# Patient Record
Sex: Female | Born: 1965 | Race: White | Hispanic: No | Marital: Married | Smoking: Current every day smoker
Health system: Southern US, Community
[De-identification: ages and names within clinical notes are randomized; demographics above are authoritative.]

## PROBLEM LIST (undated history)

## (undated) DIAGNOSIS — Z973 Presence of spectacles and contact lenses: Secondary | ICD-10-CM

## (undated) DIAGNOSIS — M545 Low back pain, unspecified: Secondary | ICD-10-CM

## (undated) DIAGNOSIS — R29898 Other symptoms and signs involving the musculoskeletal system: Secondary | ICD-10-CM

## (undated) DIAGNOSIS — G4700 Insomnia, unspecified: Secondary | ICD-10-CM

## (undated) DIAGNOSIS — M94261 Chondromalacia, right knee: Secondary | ICD-10-CM

## (undated) DIAGNOSIS — M26609 Unspecified temporomandibular joint disorder, unspecified side: Secondary | ICD-10-CM

## (undated) DIAGNOSIS — M1990 Unspecified osteoarthritis, unspecified site: Secondary | ICD-10-CM

## (undated) DIAGNOSIS — R112 Nausea with vomiting, unspecified: Secondary | ICD-10-CM

## (undated) DIAGNOSIS — F32A Depression, unspecified: Secondary | ICD-10-CM

## (undated) DIAGNOSIS — R200 Anesthesia of skin: Secondary | ICD-10-CM

## (undated) DIAGNOSIS — G588 Other specified mononeuropathies: Secondary | ICD-10-CM

## (undated) DIAGNOSIS — M65961 Unspecified synovitis and tenosynovitis, right lower leg: Secondary | ICD-10-CM

## (undated) DIAGNOSIS — R413 Other amnesia: Secondary | ICD-10-CM

## (undated) DIAGNOSIS — Z8489 Family history of other specified conditions: Secondary | ICD-10-CM

## (undated) DIAGNOSIS — Z9889 Other specified postprocedural states: Secondary | ICD-10-CM

## (undated) DIAGNOSIS — F419 Anxiety disorder, unspecified: Secondary | ICD-10-CM

## (undated) DIAGNOSIS — F411 Generalized anxiety disorder: Secondary | ICD-10-CM

## (undated) DIAGNOSIS — E28319 Asymptomatic premature menopause: Secondary | ICD-10-CM

## (undated) DIAGNOSIS — J45998 Other asthma: Secondary | ICD-10-CM

## (undated) DIAGNOSIS — F329 Major depressive disorder, single episode, unspecified: Secondary | ICD-10-CM

## (undated) DIAGNOSIS — M659 Synovitis and tenosynovitis, unspecified: Secondary | ICD-10-CM

## (undated) DIAGNOSIS — Z87820 Personal history of traumatic brain injury: Secondary | ICD-10-CM

## (undated) HISTORY — DX: Anxiety disorder, unspecified: F41.9

## (undated) HISTORY — DX: Low back pain, unspecified: M54.50

## (undated) HISTORY — PX: OTHER SURGICAL HISTORY: SHX169

## (undated) HISTORY — DX: Low back pain: M54.5

## (undated) HISTORY — DX: Insomnia, unspecified: G47.00

---

## 2005-12-29 ENCOUNTER — Other Ambulatory Visit: Admission: RE | Admit: 2005-12-29 | Discharge: 2005-12-29 | Payer: Self-pay | Admitting: Gynecology

## 2008-05-04 HISTORY — PX: TONSILLECTOMY: SUR1361

## 2009-08-13 ENCOUNTER — Ambulatory Visit (HOSPITAL_COMMUNITY): Admission: RE | Admit: 2009-08-13 | Discharge: 2009-08-14 | Payer: Self-pay | Admitting: Orthopedic Surgery

## 2009-08-13 HISTORY — PX: OTHER SURGICAL HISTORY: SHX169

## 2010-01-17 ENCOUNTER — Ambulatory Visit (HOSPITAL_BASED_OUTPATIENT_CLINIC_OR_DEPARTMENT_OTHER): Admission: RE | Admit: 2010-01-17 | Discharge: 2010-01-18 | Payer: Self-pay | Admitting: Orthopedic Surgery

## 2010-01-17 HISTORY — PX: OTHER SURGICAL HISTORY: SHX169

## 2010-02-01 HISTORY — PX: KNEE ARTHROSCOPY: SUR90

## 2010-07-17 LAB — POCT HEMOGLOBIN-HEMACUE: Hemoglobin: 11.6 g/dL — ABNORMAL LOW (ref 12.0–15.0)

## 2010-07-23 LAB — ANAEROBIC CULTURE

## 2010-07-23 LAB — BASIC METABOLIC PANEL
BUN: 9 mg/dL (ref 6–23)
CO2: 25 mEq/L (ref 19–32)
Calcium: 9.1 mg/dL (ref 8.4–10.5)
Creatinine, Ser: 0.71 mg/dL (ref 0.4–1.2)
GFR calc Af Amer: >60 mL/min (ref >60)
GFR calc non Af Amer: >60 mL/min (ref >60)
Glucose, Bld: 105 mg/dL — ABNORMAL HIGH (ref 70–99)
Potassium: 4.3 mEq/L (ref 3.5–5.1)

## 2010-07-23 LAB — AFB CULTURE WITH SMEAR (NOT AT ARMC): Acid Fast Smear: NONE SEEN

## 2010-07-23 LAB — TISSUE CULTURE

## 2010-07-23 LAB — CBC
RDW: 17.8 % — ABNORMAL HIGH (ref 11.5–15.5)
WBC: 5.8 10*3/uL (ref 4.0–10.5)

## 2010-07-23 LAB — PROTIME-INR
INR: 1.02 (ref 0.00–1.49)
Prothrombin Time: 14.2 seconds (ref 11.6–15.2)

## 2011-05-21 DIAGNOSIS — M76829 Posterior tibial tendinitis, unspecified leg: Secondary | ICD-10-CM | POA: Insufficient documentation

## 2015-04-09 ENCOUNTER — Encounter: Payer: Self-pay | Admitting: Neurology

## 2015-04-09 ENCOUNTER — Ambulatory Visit (INDEPENDENT_AMBULATORY_CARE_PROVIDER_SITE_OTHER): Payer: Managed Care, Other (non HMO) | Admitting: Neurology

## 2015-04-09 VITALS — BP 133/86 | HR 65 | Ht 66.0 in | Wt 161.0 lb

## 2015-04-09 DIAGNOSIS — R413 Other amnesia: Secondary | ICD-10-CM

## 2015-04-09 DIAGNOSIS — M542 Cervicalgia: Secondary | ICD-10-CM | POA: Diagnosis not present

## 2015-04-09 DIAGNOSIS — S060X9A Concussion with loss of consciousness of unspecified duration, initial encounter: Secondary | ICD-10-CM | POA: Insufficient documentation

## 2015-04-09 DIAGNOSIS — G43109 Migraine with aura, not intractable, without status migrainosus: Secondary | ICD-10-CM

## 2015-04-09 DIAGNOSIS — G43909 Migraine, unspecified, not intractable, without status migrainosus: Secondary | ICD-10-CM | POA: Insufficient documentation

## 2015-04-09 DIAGNOSIS — S060X1D Concussion with loss of consciousness of 30 minutes or less, subsequent encounter: Secondary | ICD-10-CM

## 2015-04-09 MED ORDER — NORTRIPTYLINE HCL 25 MG PO CAPS: ORAL_CAPSULE | ORAL | Status: DC

## 2015-04-09 MED ORDER — RIZATRIPTAN BENZOATE 10 MG PO TBDP: 10.0000 mg | ORAL_TABLET | ORAL | Status: DC | PRN

## 2015-04-09 NOTE — Progress Notes (Signed)
PATIENT: Carly Gonzalez DOB: May 16, 1965  Chief Complaint  Patient presents with  . Migraine    Her migraines are increasing in frequency and she is getting 4-5 per week.  She tends to have vision changes, nausea, memory loss, anxiety, light and noise sensitivity associated with her headaches.  She was restarted on amitriptyline  daily six weeks ago but it has not been helfpul yet.      HISTORICAL  Carly Gonzalez is a 49 years old left-handed female, alone at today's clinical visit, she is seen in refer by her primary care physician Dr.  Hadley Pen in April 09 2015 for evaluation of migraine headaches  She has multiple right leg surgery since 2008 after fell, suffered right ankle, tibial and fibular fracture.  She has to go through intense physical therapy, had fell multiple times during that period of time, fell to the concrete floor, bumped her head in the corner of cabinet, she did have transient loss of consciousness, she then began to have frequent headaches, her typical headache started from left occipital region spreading forward become left retro-orbital area pressure pain sometimes throbbing, with associated light noise sensitivity, nauseous, it can last few hours to 1 day,  Previously she was treated with amitriptyline which has been helpful  From 2010 to 2015, she was almost headache free, was able to taper off amitriptyline, since May 2016, after she started to work, she feels stressful, she also began to notice increased headaches, with similar patterns, left retro-orbital area severe pounding migraine headaches, she is now having 4-5 migraine headaches each week, she was put back on amitriptyline 25 mg every night, was able to tolerated well, but there is no significant improvement in her headaches, she was also given Imitrex tablet as needed, which help some,  She also complains of mild memory trouble, difficulty focusing  REVIEW OF SYSTEMS: Full 14 system  review of systems performed and notable only for as above: Weight gain, fatigue, swelling legs, blurred vision, feeling hot feeling hot, increased thirst, flushing   ALLERGIES: No Known Allergies  HOME MEDICATIONS: Current Outpatient Prescriptions  Medication Sig Dispense Refill  . amitriptyline (ELAVIL) 25 MG tablet Take 25 mg by mouth at bedtime.    . clonazePAM (KLONOPIN) 0.5 MG tablet   0  . gabapentin (NEURONTIN) 300 MG capsule   0  . LORazepam (ATIVAN) 1 MG tablet take 1 tablet by mouth once daily if needed  0  . meloxicam (MOBIC) 15 MG tablet Take 15 mg by mouth as needed for pain.    . predniSONE (STERAPRED UNI-PAK 21 TAB) 10 MG (21) TBPK tablet take as directed PER PACKAGE INSTRUCTIONS  0  . SUMAtriptan (IMITREX) 100 MG tablet take 1 tablet by mouth AT ONSET OF HEADACHES. MAY REPEAT 1 TABLET...  (REFER TO PRESCRIPTION NOTES).  0  . traZODone (DESYREL) 50 MG tablet   0   No current facility-administered medications for this visit.    PAST MEDICAL HISTORY: Past Medical History  Diagnosis Date  . Anxiety   . Insomnia   . Migraine     PAST SURGICAL HISTORY: Past Surgical History  Procedure Laterality Date  . Leg surgery      x 7    FAMILY HISTORY: Family History  Problem Relation Age of Onset  . Healthy Mother   . Heart attack Father     SOCIAL HISTORY:  Social History   Social History  . Marital Status: Married    Spouse Name:  N/A  . Number of Children: 2  . Years of Education: College   Occupational History  . 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 Smoking status: Former Games developermoker  . Smokeless tobacco: Not on file     Comment: Stopped 10 years ago  . Alcohol Use: 0.0 oz/week    0 Standard drinks or equivalent per week     Comment: Occasional wine  . Drug Use: No  . Sexual Activity: Not on file   Other Topics Concern  . Not on file   Social History Narrative   Lives at home with family.   Left-handed.   2  cups caffeine per day.     PHYSICAL EXAM   Filed Vitals:   04/09/15 1613  BP: 133/86  Pulse: 65  Height: 5\' 6"  (1.676 m)  Weight: 161 lb (73.029 kg)    Not recorded      Body mass index is 26 kg/(m^2).  PHYSICAL EXAMNIATION:  Gen: NAD, conversant, well nourised, obese, well groomed                     Cardiovascular: Regular rate rhythm, no peripheral edema, warm, nontender. Eyes: Conjunctivae clear without exudates or hemorrhage Neck: Supple, no carotid bruise. Pulmonary: Clear to auscultation bilaterally  Neuromusculoskeletal: Severe tenderness along left sternal:   NEUROLOGICAL EXAM:  MENTAL STATUS: Speech:    Speech is normal; fluent and spontaneous with normal comprehension.  Cognition:     Orientation to time, place and person     Normal recent and remote memory     Normal Attention span and concentration     Normal Language, naming, repeating,spontaneous speech     Fund of knowledge   CRANIAL NERVES: CN II: Visual fields are full to confrontation. Fundoscopic exam is normal with sharp discs and no vascular changes. Pupils are round equal and briskly reactive to light. CN III, IV, VI: extraocular movement are normal. No ptosis. CN V: Facial sensation is intact to pinprick in all 3 divisions bilaterally. Corneal responses are intact.  CN VII: Face is symmetric with normal eye closure and smile. CN VIII: Hearing is normal to rubbing fingers CN IX, X: Palate elevates symmetrically. Phonation is normal. CN XI: Head turning and shoulder shrug are intact CN XII: Tongue is midline with normal movements and no atrophy.  MOTOR: There is no pronator drift of out-stretched arms. Muscle bulk and tone are normal. Muscle strength is normal.  REFLEXES: Reflexes are 2+ and symmetric at the biceps, triceps, knees, and ankles. Plantar responses are flexor.  SENSORY: Intact to light touch, pinprick, position sense, and vibration sense are intact in fingers and  toes.  COORDINATION: Rapid alternating movements and fine finger movements are intact. There is no dysmetria on finger-to-nose and heel-knee-shin.    GAIT/STANCE: Posture is normal. Gait is steady with normal steps, base, arm swing, and turning. Heel and toe walking are normal. Tandem gait is normal.  Romberg is absent.   DIAGNOSTIC DATA (LABS, IMAGING, TESTING) - I reviewed patient records, labs, notes, testing and imaging myself where available.   ASSESSMENT AND PLAN  Marylin CrosbyKathleen Aina is a 49 y.o. female   Chronic migraine with visual aura  Increase nortriptyline to 25 mg 2 tablets every night  Maxalt 10 mg as needed Memory loss  MRI of the brain to rule out structural lesions  Laboratory evaluations from primary care physician  Left Upper nuchal area pain,  Component of cervicogenic pain musculoskeletal etiology    I also performed trigger point injection today at left upper lobe region   1 cc of dexamethasone= 6 mg+ 1 cc of lidocaine= 4 mg  Injection was performed  along left nuchal line, where the  most tenderness was, she tolerated the injection well,   I also advised her to do neck stretching exercise, Heat compression      Levert Feinstein, M.D. Ph.D.  [facility] [address] [place], [place] [address] Ph: [phone] Fax: [phone]  CC: Hadley Pen, MD

## 2015-04-10 ENCOUNTER — Encounter: Payer: Self-pay | Admitting: *Deleted

## 2015-04-10 ENCOUNTER — Telehealth: Payer: Self-pay | Admitting: Neurology

## 2015-04-10 DIAGNOSIS — R413 Other amnesia: Secondary | ICD-10-CM | POA: Insufficient documentation

## 2015-04-10 NOTE — Telephone Encounter (Signed)
Patient called requesting to speak to a nurse regarding the headache she is experiencing today along with red face and believes is coming from her injection from yesterday. Please call and advise. Patient can be reached @ [phone]

## 2015-04-10 NOTE — Telephone Encounter (Signed)
Spoke to patient - she still has a headache, facial flushing (reported she usually gets this with headaches) and soreness at the injection site.  She is concerned about the injection site and Dr. Terrace ArabiaYan has offered for her to come by today so she could take a look at it. She is going to come by our office this afternoon.  She has not taken any medication for her headache.  Instructed her to take the Maxalt.

## 2015-04-11 ENCOUNTER — Telehealth: Payer: Self-pay | Admitting: *Deleted

## 2015-04-11 NOTE — Telephone Encounter (Signed)
Carly JohnsKathleen never came by our office yesterday. I called to check on her this morning and she is feeling much better today.

## 2015-04-11 NOTE — Telephone Encounter (Signed)
[facility].

## 2015-04-24 ENCOUNTER — Other Ambulatory Visit: Payer: Managed Care, Other (non HMO)

## 2015-05-28 ENCOUNTER — Ambulatory Visit: Payer: Managed Care, Other (non HMO) | Admitting: Neurology

## 2015-05-29 ENCOUNTER — Encounter: Payer: Self-pay | Admitting: Neurology

## 2016-07-07 ENCOUNTER — Encounter (HOSPITAL_BASED_OUTPATIENT_CLINIC_OR_DEPARTMENT_OTHER): Payer: Self-pay | Admitting: *Deleted

## 2016-07-07 NOTE — Progress Notes (Signed)
NPO AFTER MN.  ARRIVE AT 1030.  NEEDS HG.  WILL TAKE GABAPENTIN, NEXIUM, AND IF NEEDED HYDROCODONE AM DOS W/ SIPS OF WATER.  PER PT POSSIBLE OVERNIGHT STAY.

## 2016-07-13 ENCOUNTER — Encounter (HOSPITAL_BASED_OUTPATIENT_CLINIC_OR_DEPARTMENT_OTHER): Payer: Self-pay | Admitting: *Deleted

## 2016-07-13 ENCOUNTER — Ambulatory Visit (HOSPITAL_BASED_OUTPATIENT_CLINIC_OR_DEPARTMENT_OTHER): Payer: 59 | Admitting: Anesthesiology

## 2016-07-13 ENCOUNTER — Observation Stay (HOSPITAL_BASED_OUTPATIENT_CLINIC_OR_DEPARTMENT_OTHER)
Admission: RE | Admit: 2016-07-13 | Discharge: 2016-07-14 | Disposition: A | Discharge status: Home / Self Care | Payer: 59 | Source: Ambulatory Visit | Attending: Orthopedic Surgery | Admitting: Orthopedic Surgery

## 2016-07-13 ENCOUNTER — Encounter (HOSPITAL_COMMUNITY)
Admission: RE | Disposition: A | Discharge status: Home / Self Care | Payer: Self-pay | Source: Ambulatory Visit | Attending: Orthopedic Surgery

## 2016-07-13 DIAGNOSIS — M94261 Chondromalacia, right knee: Secondary | ICD-10-CM | POA: Diagnosis not present

## 2016-07-13 DIAGNOSIS — J45998 Other asthma: Secondary | ICD-10-CM | POA: Insufficient documentation

## 2016-07-13 DIAGNOSIS — K219 Gastro-esophageal reflux disease without esophagitis: Secondary | ICD-10-CM | POA: Insufficient documentation

## 2016-07-13 DIAGNOSIS — F411 Generalized anxiety disorder: Secondary | ICD-10-CM | POA: Insufficient documentation

## 2016-07-13 DIAGNOSIS — F17210 Nicotine dependence, cigarettes, uncomplicated: Secondary | ICD-10-CM | POA: Insufficient documentation

## 2016-07-13 DIAGNOSIS — R110 Nausea: Secondary | ICD-10-CM | POA: Insufficient documentation

## 2016-07-13 DIAGNOSIS — G4700 Insomnia, unspecified: Secondary | ICD-10-CM | POA: Insufficient documentation

## 2016-07-13 DIAGNOSIS — F419 Anxiety disorder, unspecified: Secondary | ICD-10-CM | POA: Diagnosis not present

## 2016-07-13 DIAGNOSIS — M659 Synovitis and tenosynovitis, unspecified: Secondary | ICD-10-CM | POA: Diagnosis not present

## 2016-07-13 DIAGNOSIS — M65961 Unspecified synovitis and tenosynovitis, right lower leg: Secondary | ICD-10-CM | POA: Diagnosis present

## 2016-07-13 DIAGNOSIS — Z79899 Other long term (current) drug therapy: Secondary | ICD-10-CM | POA: Diagnosis not present

## 2016-07-13 HISTORY — DX: Other symptoms and signs involving the musculoskeletal system: R29.898

## 2016-07-13 HISTORY — DX: Asymptomatic premature menopause: E28.319

## 2016-07-13 HISTORY — DX: Personal history of traumatic brain injury: Z87.820

## 2016-07-13 HISTORY — DX: Major depressive disorder, single episode, unspecified: F32.9

## 2016-07-13 HISTORY — DX: Other asthma: J45.998

## 2016-07-13 HISTORY — DX: Unspecified temporomandibular joint disorder, unspecified side: M26.609

## 2016-07-13 HISTORY — DX: Generalized anxiety disorder: F41.1

## 2016-07-13 HISTORY — DX: Unspecified osteoarthritis, unspecified site: M19.90

## 2016-07-13 HISTORY — DX: Unspecified synovitis and tenosynovitis, right lower leg: M65.961

## 2016-07-13 HISTORY — DX: Nausea with vomiting, unspecified: R11.2

## 2016-07-13 HISTORY — DX: Other amnesia: R41.3

## 2016-07-13 HISTORY — PX: KNEE ARTHROSCOPY: SHX127

## 2016-07-13 HISTORY — DX: Other specified postprocedural states: Z98.890

## 2016-07-13 HISTORY — DX: Family history of other specified conditions: Z84.89

## 2016-07-13 HISTORY — DX: Depression, unspecified: F32.A

## 2016-07-13 HISTORY — DX: Synovitis and tenosynovitis, unspecified: M65.9

## 2016-07-13 HISTORY — DX: Chondromalacia, right knee: M94.261

## 2016-07-13 HISTORY — DX: Other specified mononeuropathies: G58.8

## 2016-07-13 HISTORY — DX: Presence of spectacles and contact lenses: Z97.3

## 2016-07-13 HISTORY — DX: Anesthesia of skin: R20.0

## 2016-07-13 LAB — HEMOGLOBIN: Hemoglobin: 12.6 g/dL (ref 12.0–15.0)

## 2016-07-13 LAB — POCT HEMOGLOBIN-HEMACUE: Hemoglobin: 13.6 g/dL (ref 12.0–15.0)

## 2016-07-13 SURGERY — ARTHROSCOPY, KNEE
Anesthesia: General | Site: Knee | Laterality: Right

## 2016-07-13 MED ORDER — DEXAMETHASONE SODIUM PHOSPHATE 4 MG/ML IJ SOLN
INTRAMUSCULAR | Status: DC | PRN
  Administered 2016-07-13: 10 mg via INTRAVENOUS

## 2016-07-13 MED ORDER — TRAZODONE HCL 50 MG PO TABS
100.0000 mg | ORAL_TABLET | Freq: Every day | ORAL | Status: DC
  Administered 2016-07-13: 22:00:00 100 mg via ORAL
  Filled 2016-07-13: qty 2
  Filled 2016-07-13: qty 1

## 2016-07-13 MED ORDER — FENTANYL CITRATE (PF) 100 MCG/2ML IJ SOLN
INTRAMUSCULAR | Status: DC | PRN
  Administered 2016-07-13 (×2): 25 ug via INTRAVENOUS
  Administered 2016-07-13: 50 ug via INTRAVENOUS

## 2016-07-13 MED ORDER — HYDROCODONE-ACETAMINOPHEN 5-325 MG PO TABS
1.0000 | ORAL_TABLET | Freq: Four times a day (QID) | ORAL | Status: DC | PRN
  Administered 2016-07-13: 1 via ORAL
  Filled 2016-07-13 (×2): qty 1

## 2016-07-13 MED ORDER — BUPIVACAINE HCL 0.25 % IJ SOLN
INTRAMUSCULAR | Status: DC | PRN
  Administered 2016-07-13: 20 mL

## 2016-07-13 MED ORDER — ASPIRIN EC 81 MG PO TBEC: 81.0000 mg | DELAYED_RELEASE_TABLET | Freq: Every day | ORAL | 0 refills | Status: AC

## 2016-07-13 MED ORDER — PANTOPRAZOLE SODIUM 40 MG PO TBEC
80.0000 mg | DELAYED_RELEASE_TABLET | Freq: Every day | ORAL | Status: DC
  Administered 2016-07-14: 11:00:00 80 mg via ORAL
  Filled 2016-07-13 (×2): qty 2

## 2016-07-13 MED ORDER — DIPHENHYDRAMINE HCL 12.5 MG/5ML PO ELIX
12.5000 mg | ORAL_SOLUTION | ORAL | Status: DC | PRN
  Filled 2016-07-13: qty 10

## 2016-07-13 MED ORDER — OXYCODONE HCL 5 MG PO TABS: 5.0000 mg | ORAL_TABLET | ORAL | 0 refills | Status: DC | PRN

## 2016-07-13 MED ORDER — SODIUM CHLORIDE 0.9 % IR SOLN
Status: DC | PRN
  Administered 2016-07-13: 6000 mL

## 2016-07-13 MED ORDER — MEPERIDINE HCL 25 MG/ML IJ SOLN
6.2500 mg | INTRAMUSCULAR | Status: DC | PRN
  Filled 2016-07-13: qty 1

## 2016-07-13 MED ORDER — PROPOFOL 10 MG/ML IV BOLUS
INTRAVENOUS | Status: AC
  Filled 2016-07-13: qty 20

## 2016-07-13 MED ORDER — LIDOCAINE 2% (20 MG/ML) 5 ML SYRINGE
INTRAMUSCULAR | Status: DC | PRN
  Administered 2016-07-13: 100 mg via INTRAVENOUS

## 2016-07-13 MED ORDER — GABAPENTIN 400 MG PO CAPS
800.0000 mg | ORAL_CAPSULE | Freq: Every day | ORAL | Status: DC
  Administered 2016-07-13: 22:00:00 800 mg via ORAL
  Filled 2016-07-13: qty 2

## 2016-07-13 MED ORDER — MIDAZOLAM HCL 2 MG/2ML IJ SOLN
0.5000 mg | Freq: Once | INTRAMUSCULAR | Status: DC | PRN
  Filled 2016-07-13: qty 2

## 2016-07-13 MED ORDER — OXYCODONE HCL 5 MG PO TABS
5.0000 mg | ORAL_TABLET | ORAL | Status: DC | PRN
  Administered 2016-07-13 – 2016-07-14 (×6): 10 mg via ORAL
  Filled 2016-07-13 (×7): qty 2

## 2016-07-13 MED ORDER — ONDANSETRON 4 MG PO TBDP: 4.0000 mg | ORAL_TABLET | Freq: Three times a day (TID) | ORAL | 0 refills | Status: DC | PRN

## 2016-07-13 MED ORDER — ONDANSETRON HCL 4 MG/2ML IJ SOLN
INTRAMUSCULAR | Status: DC | PRN
Start: 2016-07-13 — End: 2016-07-13
  Administered 2016-07-13: 4 mg via INTRAVENOUS

## 2016-07-13 MED ORDER — MIDAZOLAM HCL 2 MG/2ML IJ SOLN
INTRAMUSCULAR | Status: AC
  Filled 2016-07-13: qty 2

## 2016-07-13 MED ORDER — MORPHINE SULFATE (PF) 4 MG/ML IV SOLN: 2.0000 mg | INTRAVENOUS | Status: DC | PRN

## 2016-07-13 MED ORDER — PROMETHAZINE HCL 25 MG/ML IJ SOLN
INTRAMUSCULAR | Status: AC
  Filled 2016-07-13: qty 1

## 2016-07-13 MED ORDER — FENTANYL CITRATE (PF) 100 MCG/2ML IJ SOLN
25.0000 ug | INTRAMUSCULAR | Status: DC | PRN
  Administered 2016-07-13: 50 ug via INTRAVENOUS
  Filled 2016-07-13: qty 1

## 2016-07-13 MED ORDER — LIDOCAINE 2% (20 MG/ML) 5 ML SYRINGE
INTRAMUSCULAR | Status: AC
  Filled 2016-07-13: qty 5

## 2016-07-13 MED ORDER — ONDANSETRON HCL 4 MG PO TABS
4.0000 mg | ORAL_TABLET | Freq: Four times a day (QID) | ORAL | Status: DC | PRN
  Filled 2016-07-13: qty 1

## 2016-07-13 MED ORDER — SCOPOLAMINE 1 MG/3DAYS TD PT72
1.0000 | MEDICATED_PATCH | TRANSDERMAL | Status: DC
  Administered 2016-07-13: 1.5 mg via TRANSDERMAL
  Filled 2016-07-13: qty 1

## 2016-07-13 MED ORDER — PROPOFOL 10 MG/ML IV BOLUS
INTRAVENOUS | Status: DC | PRN
  Administered 2016-07-13: 200 mg via INTRAVENOUS

## 2016-07-13 MED ORDER — GABAPENTIN 400 MG PO CAPS
400.0000 mg | ORAL_CAPSULE | Freq: Two times a day (BID) | ORAL | Status: DC
  Administered 2016-07-13: 400 mg via ORAL
  Filled 2016-07-13: qty 1

## 2016-07-13 MED ORDER — PROMETHAZINE HCL 25 MG/ML IJ SOLN
6.2500 mg | INTRAMUSCULAR | Status: DC | PRN
  Administered 2016-07-13: 6.25 mg via INTRAVENOUS
  Filled 2016-07-13: qty 1

## 2016-07-13 MED ORDER — ACETAMINOPHEN 650 MG RE SUPP
650.0000 mg | Freq: Four times a day (QID) | RECTAL | Status: DC | PRN
  Filled 2016-07-13: qty 1

## 2016-07-13 MED ORDER — LACTATED RINGERS IV SOLN
INTRAVENOUS | Status: DC
Start: 2016-07-13 — End: 2016-07-13
  Administered 2016-07-13 (×2): via INTRAVENOUS
  Filled 2016-07-13: qty 1000

## 2016-07-13 MED ORDER — CHLORHEXIDINE GLUCONATE 4 % EX LIQD
60.0000 mL | Freq: Once | CUTANEOUS | Status: DC
  Filled 2016-07-13: qty 118

## 2016-07-13 MED ORDER — FENTANYL CITRATE (PF) 100 MCG/2ML IJ SOLN
INTRAMUSCULAR | Status: AC
  Filled 2016-07-13: qty 2

## 2016-07-13 MED ORDER — MORPHINE SULFATE (PF) 2 MG/ML IV SOLN
2.0000 mg | INTRAVENOUS | Status: DC | PRN
  Filled 2016-07-13: qty 1

## 2016-07-13 MED ORDER — MIDAZOLAM HCL 5 MG/5ML IJ SOLN
INTRAMUSCULAR | Status: DC | PRN
  Administered 2016-07-13: 2 mg via INTRAVENOUS

## 2016-07-13 MED ORDER — DEXAMETHASONE SODIUM PHOSPHATE 10 MG/ML IJ SOLN
INTRAMUSCULAR | Status: AC
  Filled 2016-07-13: qty 1

## 2016-07-13 MED ORDER — ONDANSETRON HCL 4 MG/2ML IJ SOLN
4.0000 mg | Freq: Four times a day (QID) | INTRAMUSCULAR | Status: DC | PRN
  Administered 2016-07-14: 01:00:00 4 mg via INTRAVENOUS
  Filled 2016-07-13 (×2): qty 2

## 2016-07-13 MED ORDER — ONDANSETRON HCL 4 MG/2ML IJ SOLN
INTRAMUSCULAR | Status: AC
  Filled 2016-07-13: qty 2

## 2016-07-13 MED ORDER — ACETAMINOPHEN 325 MG PO TABS
650.0000 mg | ORAL_TABLET | Freq: Four times a day (QID) | ORAL | Status: DC | PRN
  Administered 2016-07-14: 10:00:00 650 mg via ORAL
  Filled 2016-07-13 (×2): qty 2

## 2016-07-13 MED ORDER — CYCLOBENZAPRINE HCL 5 MG PO TABS
5.0000 mg | ORAL_TABLET | Freq: Every day | ORAL | Status: DC
  Administered 2016-07-13: 22:00:00 5 mg via ORAL
  Filled 2016-07-13 (×2): qty 1

## 2016-07-13 MED ORDER — SCOPOLAMINE 1 MG/3DAYS TD PT72
MEDICATED_PATCH | TRANSDERMAL | Status: AC
  Filled 2016-07-13: qty 1

## 2016-07-13 MED ORDER — LORAZEPAM 1 MG PO TABS
1.0000 mg | ORAL_TABLET | Freq: Four times a day (QID) | ORAL | Status: DC | PRN
  Administered 2016-07-13 – 2016-07-14 (×3): 1 mg via ORAL
  Filled 2016-07-13 (×4): qty 1

## 2016-07-13 MED ORDER — GABAPENTIN 400 MG PO CAPS
400.0000 mg | ORAL_CAPSULE | Freq: Every day | ORAL | Status: DC
  Administered 2016-07-14: 11:00:00 400 mg via ORAL
  Filled 2016-07-13 (×2): qty 1

## 2016-07-13 MED ORDER — CEFAZOLIN SODIUM-DEXTROSE 2-4 GM/100ML-% IV SOLN
2.0000 g | INTRAVENOUS | Status: AC
  Administered 2016-07-13: 2 g via INTRAVENOUS
  Filled 2016-07-13: qty 100

## 2016-07-13 MED ORDER — CEFAZOLIN SODIUM-DEXTROSE 2-4 GM/100ML-% IV SOLN
INTRAVENOUS | Status: AC
  Filled 2016-07-13: qty 100

## 2016-07-13 SURGICAL SUPPLY — 48 items
BANDAGE ELASTIC 6 VELCRO ST LF (GAUZE/BANDAGES/DRESSINGS) ×3 IMPLANT
BANDAGE ESMARK 6X9 LF (GAUZE/BANDAGES/DRESSINGS) IMPLANT
BLADE CUDA GRT WHITE 3.5 (BLADE) ×3 IMPLANT
BLADE CUTTER GATOR 3.5 (BLADE) ×3 IMPLANT
BLADE GREAT WHITE 4.2 (BLADE) IMPLANT
BLADE GREAT WHITE 4.2MM (BLADE)
BNDG CMPR 9X6 STRL LF SNTH (GAUZE/BANDAGES/DRESSINGS)
BNDG ESMARK 6X9 LF (GAUZE/BANDAGES/DRESSINGS)
CUFF TOURNIQUET SINGLE 34IN LL (TOURNIQUET CUFF) ×3 IMPLANT
DRAPE ARTHROSCOPY W/POUCH 114 (DRAPES) ×3 IMPLANT
DRAPE C-ARM 42X72 X-RAY (DRAPES) IMPLANT
DRAPE LG THREE QUARTER DISP (DRAPES) ×3 IMPLANT
DRAPE U-SHAPE 47X51 STRL (DRAPES) ×3 IMPLANT
DRSG PAD ABDOMINAL 8X10 ST (GAUZE/BANDAGES/DRESSINGS) ×4 IMPLANT
DURAPREP 26ML APPLICATOR (WOUND CARE) ×3 IMPLANT
GAUZE SPONGE 4X4 8PLY STR LF (GAUZE/BANDAGES/DRESSINGS) ×2 IMPLANT
GAUZE XEROFORM 1X8 LF (GAUZE/BANDAGES/DRESSINGS) ×3 IMPLANT
GLOVE BIO SURGEON STRL SZ7.5 (GLOVE) ×3 IMPLANT
GLOVE BIOGEL PI IND STRL 7.5 (GLOVE) IMPLANT
GLOVE BIOGEL PI IND STRL 8 (GLOVE) ×1 IMPLANT
GLOVE BIOGEL PI INDICATOR 7.5 (GLOVE) ×6
GLOVE BIOGEL PI INDICATOR 8 (GLOVE) ×2
GOWN STRL REUS W/ TWL XL LVL3 (GOWN DISPOSABLE) IMPLANT
GOWN STRL REUS W/TWL LRG LVL3 (GOWN DISPOSABLE) ×4 IMPLANT
GOWN STRL REUS W/TWL XL LVL3 (GOWN DISPOSABLE)
IV NS IRRIG 3000ML ARTHROMATIC (IV SOLUTION) ×6 IMPLANT
KIT RM TURNOVER CYSTO AR (KITS) ×3 IMPLANT
KNEE WRAP E Z 3 GEL PACK (MISCELLANEOUS) ×3 IMPLANT
MANIFOLD NEPTUNE II (INSTRUMENTS) ×3 IMPLANT
NEEDLE HYPO 22GX1.5 SAFETY (NEEDLE) ×3 IMPLANT
PACK ARTHROSCOPY DSU (CUSTOM PROCEDURE TRAY) ×3 IMPLANT
PACK BASIN DAY SURGERY FS (CUSTOM PROCEDURE TRAY) ×3 IMPLANT
PAD ARMBOARD 7.5X6 YLW CONV (MISCELLANEOUS) IMPLANT
PADDING CAST ABS 4INX4YD NS (CAST SUPPLIES) ×2
PADDING CAST ABS COTTON 4X4 ST (CAST SUPPLIES) IMPLANT
PROBE BIPOLAR 50 DEGREE SUCT (MISCELLANEOUS) IMPLANT
PROBE BIPOLAR ATHRO 135MM 90D (MISCELLANEOUS) IMPLANT
SET ARTHROSCOPY TUBING (MISCELLANEOUS) ×3
SET ARTHROSCOPY TUBING LN (MISCELLANEOUS) ×1 IMPLANT
SHAVER 4.2 MM LANZA 9391A (BLADE) IMPLANT
SPONGE GAUZE 4X4 12PLY (GAUZE/BANDAGES/DRESSINGS) ×3 IMPLANT
SUT ETHILON 3 0 PS 1 (SUTURE) ×3 IMPLANT
SYR CONTROL 10ML LL (SYRINGE) ×3 IMPLANT
TOWEL OR 17X24 6PK STRL BLUE (TOWEL DISPOSABLE) ×3 IMPLANT
TUBE CONNECTING 12'X1/4 (SUCTIONS) ×1
TUBE CONNECTING 12X1/4 (SUCTIONS) ×2 IMPLANT
WAND 30 DEG SABER W/CORD (SURGICAL WAND) IMPLANT
WATER STERILE IRR 500ML POUR (IV SOLUTION) ×3 IMPLANT

## 2016-07-13 NOTE — Transfer of Care (Signed)
Immediate Anesthesia Transfer of Care Note  Patient: Carly CrosbyKathleen Gonzalez  Procedure(s) Performed: Procedure(s) (LRB): Right knee arthroscopy with limited synovectomy and chondroplasty medial femeral chondial, lateral tibia plateau  (Right)  Patient Location: PACU  Anesthesia Type: General  Level of Consciousness: awake, oriented, sedated and patient cooperative  Airway & Oxygen Therapy: Patient Spontanous Breathing and Patient connected to face mask oxygen  Post-op Assessment: Report given to PACU RN and Post -op Vital signs reviewed and stable  Post vital signs: Reviewed and stable  Complications: No apparent anesthesia complications

## 2016-07-13 NOTE — H&P (Signed)
ORTHOPAEDIC H and P  REQUESTING PHYSICIAN: Yolonda KidaJason Patrick Jaedah Lords, MD  PCP:  Hadley PenOBBINS,ROBERT A, MD  Chief Complaint: Right knee pain  HPI: Carly Gonzalez is a 51 y.o. female who complains of recalcitrant right knee pain and swelling.  She has a hx of work related injury to bilateral knees and back.  She has been under my care and my partner's for over 5 months.  She has right knee pain that is persistent despite injections and extensive Pt.  We have discussed arthroscopy as an option for management and she is interested in persuing that treatment and presents today for that intervention.  We reviewed the risks and benefits in my clinic last month and she is ready to proceed today with surgery.  Past Medical History:  Diagnosis Date  . Arthritis   . Chondromalacia of lateral tibial plateau, right   . Depression   . Early menopause occurring in patient age younger than 45 years   . Family history of adverse reaction to anesthesia    MOTHER-- SEVERE PONV  . GAD (generalized anxiety disorder)   . History of concussion    x4  per pt from 10/ 2010 to 2011 due to right leg numbness/ weakness post injury, leg would give way fall and hit her head--- per pt residual decreased eye sight , corrected w/ glasses,  and mild short-term memory issues  . Insomnia   . Numbness of right foot    right ankle/foot  post injury 2010  . Pinched vertebral nerve    lumbar region from fall 02-02-2016-- pain down right hip/ goin --  uses heating pad  . PONV (postoperative nausea and vomiting)    AND ANXIETY  . Poor short term memory    post concussion  . Right leg weakness    post injury  . Seasonal asthma   . Synovitis of right knee   . 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   left side recently irritated from tooth extraction due to abscess and bone infection (wk of 07-01-2016)  . Wears glasses    Past Surgical History:  Procedure Laterality Date  . CESAREAN SECTION  1994    . EXCISION RIGHT DISTAL THIGH COMMON PERONEAL NERVE [place] NEUROLYSIS  01/17/2010  . KNEE ARTHROSCOPY Right 02/2010  . ORIF RIGHT TIB-FIB FRACTURES  [facility]-01-2009   [facility]  . ORIF RIGHT TIBIA FX (PROVISIONAL) IM NAILING/ REMOVAL DEEP IMPLANTS MEDIAL TIBIA/ TAKEDOWN NONUNION SITE  08/13/2009  . TONSILLECTOMY  2010   Social History   Social History  . Marital status: Married    Spouse name: N/A  . Number of children: 2  . Years of education: College   Occupational History  . 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 Smoking status: Current Every Day Smoker    Packs/day: 0.25    Types: Cigarettes  . Smokeless tobacco: Never Used     Comment: smokes 3 cig's daily  . Alcohol use 0.0 oz/week     Comment: Occasional wine  . Drug use: No  . Sexual activity: Not Asked   Other Topics Concern  . None   Social History Narrative   Lives at home with family.   Left-handed.   2 cups caffeine per day.   Family History  Problem Relation Age of Onset  . Heart attack Father   . Healthy Mother    Allergies  Allergen Reactions  .  Hydrocodone Itching   Prior to Admission medications   Medication Sig Start Date End Date Taking? Authorizing Provider  ALBUTEROL IN Inhale into the lungs as needed.   Yes Historical Provider, MD  Cholecalciferol (VITAMIN D-3) 1000 units CAPS Take 1 capsule by mouth daily.   Yes Historical Provider, MD  cyclobenzaprine (FLEXERIL) 10 MG tablet Take 5 mg by mouth at bedtime.   Yes Historical Provider, MD  esomeprazole (NEXIUM) 40 MG capsule Take 40 mg by mouth 2 (two) times daily before a meal.   Yes Historical Provider, MD  gabapentin (NEURONTIN) 400 MG capsule Take 400 mg by mouth 2 (two) times daily. 1 tablet at lunch and  2 tablet at bedtime   Yes Historical Provider, MD  HYDROcodone-acetaminophen (NORCO/VICODIN) 5-325 MG tablet Take 1 tablet by mouth every 6 (six) hours as needed for moderate pain.   Yes  Historical Provider, MD  ibuprofen (ADVIL,MOTRIN) 800 MG tablet Take 800 mg by mouth 3 (three) times daily. Pt  given instructions to stop taking on Thursday 07-09-2016 , from dr Aundria Rud, for surgery 07-13-2016   Yes Historical Provider, MD  LORazepam (ATIVAN) 1 MG tablet take 1 tablet by mouth once daily ---  TAKES AT  BEDTIME 03/13/15  Yes Historical Provider, MD  traZODone (DESYREL) 50 MG tablet Take 100 mg by mouth at bedtime.  03/24/15  Yes Historical Provider, MD   No results found.  Positive ROS: All other systems have been reviewed and were otherwise negative with the exception of those mentioned in the HPI and as above.  Physical Exam: General: Alert, no acute distress Cardiovascular: No pedal edema Respiratory: No cyanosis, no use of accessory musculature GI: No organomegaly, abdomen is soft and non-tender Skin: No lesions in the area of chief complaint Neurologic: Sensation intact distally Psychiatric: Patient is competent for consent with normal mood and affect Lymphatic: No axillary or cervical lymphadenopathy   Assessment: Right knee chondromalacia and recurrent effusions  Plan: -to OR today for right knee arthroscopy with limited synovectomy and chondroplasties -we will plan to admit post op for observation due to her response to anesthesia in the past  -dc home tomorrow am -all risks, benefits and indications of the procedure were discussed at length and all questions from her and her husband answered in pre Op     Yolonda Kida, MD Cell [phone]    07/13/2016 12:10 PM

## 2016-07-13 NOTE — Anesthesia Preprocedure Evaluation (Addendum)
Anesthesia Evaluation  Patient identified by MRN, date of birth, ID band Patient awake    Reviewed: Allergy & Precautions, NPO status , Patient's Chart, lab work & pertinent test results  History of Anesthesia Complications (+) PONV  Airway Mallampati: II   Neck ROM: Full  Mouth opening: Limited Mouth Opening Comment: C/o TMJ discomfort Dental  (+) Dental Advisory Given, Missing   Pulmonary asthma (last inhaler used in October) , Current Smoker,    breath sounds clear to auscultation       Cardiovascular (-) anginanegative cardio ROS   Rhythm:Regular Rate:Normal     Neuro/Psych  Headaches, Anxiety Depression    GI/Hepatic Neg liver ROS, GERD  Medicated and Controlled,  Endo/Other  negative endocrine ROS  Renal/GU negative Renal ROS     Musculoskeletal  (+) Arthritis ,   Abdominal   Peds  Hematology negative hematology ROS (+)   Anesthesia Other Findings   Reproductive/Obstetrics                            Anesthesia Physical Anesthesia Plan  ASA: II  Anesthesia Plan: General   Post-op Pain Management:    Induction: Intravenous  Airway Management Planned: LMA  Additional Equipment:   Intra-op Plan:   Post-operative Plan:   Informed Consent: I have reviewed the patients History and Physical, chart, labs and discussed the procedure including the risks, benefits and alternatives for the proposed anesthesia with the patient or authorized representative who has indicated his/her understanding and acceptance.   Dental advisory given  Plan Discussed with: CRNA and Surgeon  Anesthesia Plan Comments: (Plan routine monitors, GA- LMA OK.  Pt understands airway insertion after pt asleep may exacerbate her TMJ discomfort)        Anesthesia Quick Evaluation

## 2016-07-13 NOTE — Anesthesia Postprocedure Evaluation (Addendum)
Anesthesia Post Note  Patient: Carly Gonzalez  Procedure(s) Performed: Procedure(s) (LRB): Right knee arthroscopy with limited synovectomy and chondroplasty medial femeral chondial, lateral tibia plateau  (Right)  Patient location during evaluation: PACU Anesthesia Type: General Level of consciousness: awake and alert Pain management: pain level controlled Vital Signs Assessment: post-procedure vital signs reviewed and stable Respiratory status: spontaneous breathing, nonlabored ventilation and respiratory function stable Cardiovascular status: blood pressure returned to baseline and stable Postop Assessment: no signs of nausea or vomiting Anesthetic complications: no       Last Vitals:  Vitals:   07/13/16 1419 07/13/16 1435  BP:    Pulse:    Resp:  10  Temp: 37.1 C     Last Pain:  Vitals:   07/13/16 1419  TempSrc:   PainSc: 7                  Lowella CurbWarren Ray Miller

## 2016-07-13 NOTE — Op Note (Signed)
Surgery Date: 07/13/2016  Surgeon(s): Yolonda KidaJason Patrick Rogers, MD  ANESTHESIA:  general  FLUIDS: Per anesthesia record.   ESTIMATED BLOOD LOSS: minimal  PREOPERATIVE DIAGNOSES:  1. Right knee lateral tibia chondromalacia 2. Right knee synovitis  POSTOPERATIVE DIAGNOSES:  1. Right knee lateral tibia chondromalacia grade II 2. Right knee synovitis 3. Right knee medial femoral condyle chondromalacia, grade III  PROCEDURES PERFORMED:  1. Right knee arthroscopy with limited synovectomy 2.  Right knee arthroscopy with arthroscopic chondroplasty medial femoral condyle and lateral tibial plateau.     DESCRIPTION OF PROCEDURE: Ms. Carly Gonzalez is a 51 y.o.-year-old female with Right knee pain recalcitrant to conservative management, with cnotinued effusion and pain with minimal mechanical symptoms.  She had an Mri that confirmed chondromalacia of lateral tibial plateau.  Plans are to proceed with knee arthroscopy with debridement as indicated. Full discussion held regarding risks benefits alternatives and complications related surgical intervention. Conservative care options reviewed. All questions answered.  The patient was identified in the preoperative holding area and the operative extremity was marked. The patient was brought to the operating room and transferred to operating table in a supine position. Satisfactory general anesthesia was induced by anesthesiology.    Standard anterolateral, anteromedial arthroscopy portals were obtained. The anteromedial portal was obtained with a spinal needle for localization under direct visualization with subsequent diagnostic findings.   Anteromedial and anterolateral chambers: moderate synovitis. The synovitis was debrided with a 4.5 mm full radius shaver through both the anteromedial and lateral portals.   Suprapatellar pouch and gutters: mild synovitis or debris. Patella chondral surface: Grade 0 Trochlear chondral surface: Grade 0 Patellofemoral  tracking: midline Medial meniscus: intact no tear.  Medial femoral condyle flexion bearing surface: Grade 3 focal area with linear fissure and small chondral flap 6 mm x 3 mm Medial femoral condyle extension bearing surface: Grade 0 Medial tibial plateau: Grade 0 Anterior cruciate ligament:stable Posterior cruciate ligament:stable Lateral meniscus: no tears, intact.   Lateral femoral condyle flexion bearing surface: Grade 0 Lateral femoral condyle extension bearing surface: Grade 0 Lateral tibial plateau: Grade 2 Chondroplasty was carried out on the lateral plateau with motorized shaver to stable tissue.  The medial femoral condyle requred bitters and motor shaver to establish stable shoulder and non friable tissue.  After completion of synovectomy, diagnostic exam, and debridements as described, all compartments were checked and no residual debris remained. Hemostasis was achieved with the cautery wand. The portals were approximated with nylon. All excess fluid was expressed from the joint.  20 mL of 0.25% marcaine was injected into the portals.  Xeroform sterile gauze dressings were applied followed by Ace bandage and ice pack.   DISPOSITION: The patient was awakened from general anesthetic, extubated, taken to the recovery room in medically stable condition, no apparent complications. The patient may be weightbearing as tolerated to the operative lower extremity.  Range of motion of right knee as tolerated.

## 2016-07-13 NOTE — Anesthesia Procedure Notes (Signed)
Procedure Name: LMA Insertion Date/Time: 07/13/2016 12:48 PM Performed by: Renella CunasHAZEL, Kenasia Scheller D Pre-anesthesia Checklist: Patient identified, Emergency Drugs available, Suction available and Patient being monitored Patient Re-evaluated:Patient Re-evaluated prior to inductionOxygen Delivery Method: Circle system utilized Preoxygenation: Pre-oxygenation with 100% oxygen Intubation Type: IV induction Ventilation: Mask ventilation without difficulty LMA: LMA inserted LMA Size: 4.0 Number of attempts: 1 Airway Equipment and Method: Bite block Placement Confirmation: positive ETCO2 Tube secured with: Tape Dental Injury: Teeth and Oropharynx as per pre-operative assessment

## 2016-07-14 ENCOUNTER — Encounter (HOSPITAL_BASED_OUTPATIENT_CLINIC_OR_DEPARTMENT_OTHER): Payer: Self-pay | Admitting: Orthopedic Surgery

## 2016-07-14 DIAGNOSIS — M94261 Chondromalacia, right knee: Secondary | ICD-10-CM | POA: Diagnosis not present

## 2016-07-14 MED ORDER — MENTHOL 3 MG MT LOZG
1.0000 | LOZENGE | OROMUCOSAL | Status: DC | PRN
  Filled 2016-07-14: qty 9

## 2016-07-14 NOTE — Progress Notes (Signed)
Discharged from floor via w/c for transport home by car. Belongings & spouse with pt. No changes in assessment. Carly Gonzalez  

## 2016-07-14 NOTE — Progress Notes (Signed)
  Physical Therapy Treatment Patient Details Name: Carly CrosbyKathleen Gonzalez MRN: [id number] DOB: [birth year] Today's Date: 07/14/2016    History of Present Illness Right knee arthroscopy with limited synovectomy and chondroplasty medial femeral chondial, lateral tibia plateau  (Right)    PT Comments    *Reviewed exercises and practiced steps. Ready for DC.  Follow Up Recommendations  Home health PT     Equipment Recommendations  None recommended by PT    Recommendations for Other Services       Precautions / Restrictions Precautions Precautions: Fall;Knee    Mobility  Bed Mobility         Supine to sit: Supervision     General bed mobility comments: no assist required  Transfers   Equipment used: Rolling walker (2 wheeled) Transfers: Sit to/from Stand Sit to Stand: Supervision            Ambulation/Gait   Ambulation Distance (Feet): 100 Feet Assistive device: Rolling walker (2 wheeled) Gait Pattern/deviations: Step-to pattern;Antalgic;Decreased stance time - right;Decreased step length - right     General Gait Details: encouraged knee extension for  gait and stance , noted improved extension and WBS on the rt,   Stairs Stairs: Yes   Stair Management: No rails;Forwards;With walker Number of Stairs: 1 General stair comments: cues for technique  Wheelchair Mobility    Modified Rankin (Stroke Patients Only)       Balance                                    Cognition Arousal/Alertness: Lethargic;Suspect due to medications                          Exercises Total Joint Exercises Quad Sets: AROM;10 reps    General Comments        Pertinent Vitals/Pain Pain Score: 5  Pain Location: rt knee Pain Descriptors / Indicators: Aching Pain Intervention(s): Premedicated before session;Ice applied    Home Living                      Prior Function            PT Goals (current goals can now be found in the care  plan section) Progress towards PT goals: Progressing toward goals    Frequency    7X/week      PT Plan Current plan remains appropriate    Co-evaluation             End of Session   Activity Tolerance: Patient tolerated treatment well Patient left: in bed;with family/visitor present Nurse Communication: Mobility status PT Visit Diagnosis: Pain;History of falling (Z91.81);Difficulty in walking, not elsewhere classified (R26.2) Pain - Right/Left: Right Pain - part of body: Knee     Time: [id number] PT Time Calculation (min) (ACUTE ONLY): 31 min  Charges:  $Gait Training: 8-22 mins $Therapeutic Exercise: 8-22 mins                    G Codes:       Rada HayHill, Deysy Schabel [name] 07/14/2016, 4:13 PM Blanchard KelchKaren Ladd Cen PT [phone]

## 2016-07-14 NOTE — Progress Notes (Signed)
   Subjective:  Patient reports pain as moderate.  But improving.  Some nausea no vomiting, up with therapy this am, walked the halls  Objective:   VITALS:   Vitals:   07/13/16 2207 07/14/16 0141 07/14/16 0527 07/14/16 0929  BP: 108/65 100/61 (!) 93/59 98/62  Pulse: (!) 55 (!) 47 (!) 53 (!) 55  Resp: '15 16 16 16  '$ Temp: 97.6 F (36.4 C) 98 F (36.7 C) 98.6 F (37 C)   TempSrc: Oral Oral Oral   SpO2: 99% 98% 98% 98%  Weight:      Height:        Neurologically intact Neurovascular intact Intact pulses distally Dorsiflexion/Plantar flexion intact Incision: dressing C/D/I Compartment soft   Lab Results  Component Value Date   WBC 5.8 08/13/2009   HGB 12.6 07/13/2016   HCT 35.5 (L) 08/13/2009   MCV 78.8 08/13/2009   PLT 235 08/13/2009   BMET    Component Value Date/Time   NA 137 08/13/2009 0834   K 4.3 08/13/2009 0834   CL 106 08/13/2009 0834   CO2 25 08/13/2009 0834   GLUCOSE 105 (H) 08/13/2009 0834   BUN 9 08/13/2009 0834   CREATININE 0.71 08/13/2009 0834   CALCIUM 9.1 08/13/2009 0834   GFRNONAA >60 08/13/2009 0834   GFRAA  08/13/2009 0834    >60        The eGFR has been calculated using the MDRD equation. This calculation has not been validated in all clinical situations. eGFR's persistently <60 mL/min signify possible Chronic Kidney Disease.     Assessment/Plan: 1 Day Post-Op   Active Problems:   Chondromalacia, right knee   Synovitis of right knee   Chondromalacia of right knee   Up with therapy Dc home today with [facility] PT WBAT with assistance RLE    Nicholes Stairs 07/14/2016, 11:34 AM   Geralynn Rile, MD [phone]

## 2016-07-14 NOTE — Care Management Note (Addendum)
Case Management Note  Patient Details  Name: Carly Gonzalez MRN: [id number] Date of Birth: 05/01/1966  Subjective/Objective:                  Right knee arthroscopy  Action/Plan: Discharge planning Expected Discharge Date:  07/14/16               Expected Discharge Plan:  [place]  In-House Referral:     Discharge planning Services  CM Consult  Post Acute Care Choice:  Home Health Choice offered to:  Patient  DME Arranged:  N/A DME Agency:  NA  HH Arranged:  PT [place] Agency:  [facility]  Status of Service:  Completed, signed off  If discussed at [place] of Stay Meetings, dates discussed:    Additional Comments: CM met with pt in room to offer choice of home health agency. Pt chooses [facility] to render HHPT.  [facility] rep, [place].  Pt has all DME needed at home. No other CM needs were communicated. Dellie Catholic, RN 07/14/2016, 12:04 PM

## 2016-07-14 NOTE — Progress Notes (Addendum)
07/14/16 1016  PT Evaluation  PT Visit Information  Last PT Received On 07/14/16  Assistance Needed +1  History of Present Illness Right knee arthroscopy with limited synovectomy and chondroplasty medial femeral chondial, lateral tibia plateau  (Right)  Precautions  Precautions Fall;Knee  Home Living  Family/patient expects to be discharged to: Private residence  Living Arrangements Spouse/significant other  Available Help at Discharge Family  Type of Home House  Home Access Stairs to enter  Entrance Stairs-Number of Steps 1  Home Layout One level  Home Equipment Walker - 2 wheels;BSC;Crutches  Prior Function  Level of Independence Independent  Communication  Communication No difficulties  Pain Assessment  Pain Score 5  Pain Location rt knee  Pain Descriptors / Indicators Aching  Cognition  Arousal/Alertness Awake/alert  Behavior During Therapy WFL for tasks assessed/performed  Overall Cognitive Status Within Functional Limits for tasks assessed  Lower Extremity Assessment  Lower Extremity Assessment RLE deficits/detail  RLE Deficits / Details pt in bed with pillows under the knee, per pt. due to back problems  Cervical / Trunk Assessment  Cervical / Trunk Assessment Normal  Bed Mobility  Overal bed mobility Needs Assistance  Bed Mobility Supine to Sit  Supine to sit Supervision  General bed mobility comments no assist required  Transfers  Overall transfer level Needs assistance  Equipment used Rolling walker (2 wheeled)  Transfers Sit to/from Stand  Sit to Stand Supervision  General transfer comment cues for hand and  arm plcement  Ambulation/Gait  Ambulation/Gait assistance Min guard  Ambulation Distance (Feet) 150 Feet  Assistive device Rolling walker (2 wheeled)  Gait Pattern/deviations Step-to pattern;Antalgic;Decreased stance time - right;Decreased step length - right  General Gait Details encouraged knee extension for  gait and stance , noted improved  extension and WBS on the rt,  Total Joint Exercises  Ankle Circles/Pumps AROM;Right;10 reps  Quad Sets AROM;Right;10 reps  Heel Slides AAROM;Right;10 reps  Straight Leg Raises AAROM;Right;10 reps  PT - End of Session  Activity Tolerance Patient tolerated treatment well  Patient left in chair;with call bell/phone within reach;with family/visitor present  Nurse Communication Mobility status  PT Assessment  PT Recommendation/Assessment Patient needs continued PT services  PT Visit Diagnosis Pain;History of falling (Z91.81);Difficulty in walking, not elsewhere classified (R26.2)  Pain - Right/Left Right  Pain - part of body Knee  PT Problem List Decreased strength;Decreased range of motion;Decreased activity tolerance;Decreased mobility;Decreased knowledge of precautions;Decreased safety awareness;Decreased knowledge of use of DME;Pain  PT Plan  PT Frequency (ACUTE ONLY) 7X/week  PT Treatment/Interventions (ACUTE ONLY) DME instruction;Gait training;Stair training;Functional mobility training;Therapeutic activities;Therapeutic exercise;Patient/family education  AM-PAC PT "6 Clicks" Daily Activity Outcome Measure  Difficulty turning over in bed (including adjusting bedclothes, sheets and blankets)? 4  Difficulty moving from lying on back to sitting on the side of the bed?  4  Difficulty sitting down on and standing up from a chair with arms (e.g., wheelchair, bedside commode, etc,.)? 4  Help needed moving to and from a bed to chair (including a wheelchair)? 3  Help needed walking in hospital room? 3  Help needed climbing 3-5 steps with a railing?  3  6 Click Score 21  Mobility G Code  CJ  PT Recommendation  Follow Up Recommendations Home health PT  PT equipment None recommended by PT  Individuals Consulted  Consulted and Agree with Results and Recommendations Patient  Acute Rehab PT Goals  Patient Stated Goal to have HHPT before OPPT  PT Goal Formulation With patient/family  Time For  Goal Achievement 07/16/16  Potential to Achieve Goals Good  PT Time Calculation  PT Start Time (ACUTE ONLY) 0822  PT Stop Time (ACUTE ONLY) 0910  PT Time Calculation (min) (ACUTE ONLY) 48 min  PT G-Codes **NOT FOR INPATIENT CLASS**  Functional Assessment Tool Used AM-PAC 6 Clicks Basic Mobility;Clinical judgement  Functional Limitation Mobility: Walking and moving around  Mobility: Walking and Moving Around Current Status (Z6109(G8978) CI  Mobility: Walking and Moving Around Goal Status (U0454(G8979) CH  PT General Charges  $$ ACUTE PT VISIT 1 Procedure  PT Evaluation  $PT Eval Low Complexity 1 Procedure  PT Treatments  $Gait Training 8-22 mins  $Therapeutic Exercise 8-22 mins     MilroyKaren Brendan Gadson PT [phone]

## 2016-07-15 LAB — HIV ANTIBODY (ROUTINE TESTING W REFLEX): HIV Screen 4th Generation wRfx: NONREACTIVE

## 2016-07-26 NOTE — Discharge Summary (Signed)
Patient ID: Carly Gonzalez MRN: [id number] DOB/AGE: Sep 25, 1965 51 y.o.  Admit date: 07/13/2016 Discharge date: 07/14/2016  Primary Diagnosis: Right knee chondromalacia  Admission Diagnoses:  Past Medical History:  Diagnosis Date  . Anxiety   . Arthritis   . Chondromalacia of lateral tibial plateau, right   . Depression   . Early menopause occurring in patient age younger than 45 years   . Family history of adverse reaction to anesthesia    MOTHER-- SEVERE PONV  . GAD (generalized anxiety disorder)   . History of concussion    x4  per pt from 10/ 2010 to 2011 due to right leg numbness/ weakness post injury, leg would give way fall and hit her head--- per pt residual decreased eye sight , corrected w/ glasses,  and mild short-term memory issues  . Insomnia   . Numbness of right foot    right ankle/foot  post injury 2010  . Pinched vertebral nerve    lumbar region from fall 02-02-2016-- pain down right hip/ goin --  uses heating pad  . PONV (postoperative nausea and vomiting)    AND ANXIETY  . Poor short term memory    post concussion  . Right leg weakness    post injury  . Seasonal asthma   . Synovitis of right knee   . 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   left side recently irritated from tooth extraction due to abscess and bone infection (wk of 07-01-2016)  . Wears glasses    Discharge Diagnoses:   Active Problems:   Chondromalacia, right knee   Synovitis of right knee   Chondromalacia of right knee  Estimated body mass index is 25.28 kg/m as calculated from the following:   Height as of this encounter: 5' 6.5" (1.689 m).   Weight as of this encounter: 72.1 kg (159 lb).  Procedure:  Procedure(s) (LRB): Right knee arthroscopy with limited synovectomy and chondroplasty medial femeral chondial, lateral tibia plateau  (Right)   Consults: None  HPI: Carly Gonzalez is a 51 yo female who presented to my clinic for workers comp related back and  knee injuries.  She has failed many months of right knee pain and was worked up with an MRI for failure to respond to conservative measures.  We did find a few areas of chondromalacia.  She failed to respond to intra articular injection and we elected to proceed with surgery.  Laboratory Data: Admission on 07/13/2016, Discharged on 07/14/2016  Component Date Value Ref Range Status  . Hemoglobin 07/13/2016 12.6  12.0 - 15.0 g/dL Final  . Hemoglobin 40/02/2724 13.6  12.0 - 15.0 g/dL Final  . HIV Screen 4th Generation wRfx 07/13/2016 Non Reactive  Non Reactive Final   Comment: (NOTE) Performed At: [facility] [address] [place], [place] [id number] Mila Homer MD QQ:[id number]      X-Rays:No results found.  EKG:No orders found for this or any previous visit.   Hospital Course: Carly Gonzalez is a 51 y.o. who was admitted to Hospital. They were brought to the operating room on 07/13/2016 and underwent Procedure(s): Right knee arthroscopy with limited synovectomy and chondroplasty medial femeral chondial, lateral tibia plateau .  Patient tolerated the procedure well and was later transferred to the recovery room and then to the orthopaedic floor for postoperative care.  They were given PO and IV analgesics for pain control following their surgery.  They were given 24 hours of postoperative antibiotics.  Anti-infectives    Start  Dose/Rate Route Frequency Ordered Stop   07/13/16 1048  ceFAZolin (ANCEF) IVPB 2g/100 mL premix     2 g 200 mL/hr over 30 Minutes Intravenous On call to O.R. 07/13/16 1048 07/13/16 1315     and started on DVT prophylaxis in the form of Aspirin.   PT and OT were ordered.  By day two, the patient had progressed with therapy and meeting their goals.  Incision was healing well.  Patient was seen in rounds and was ready to go home.   Diet: Regular diet Activity:WBAT Follow-up:in 2 weeks Disposition - Home Discharged Condition:  good   Discharge Instructions    Call MD / Call 911    Complete by:  As directed    If you experience chest pain or shortness of breath, CALL 911 and be transported to the hospital emergency room.  If you develope a fever above 101 F, pus (white drainage) or increased drainage or redness at the wound, or calf pain, call your surgeon's office.   Constipation Prevention    Complete by:  As directed    Drink plenty of fluids.  Prune juice may be helpful.  You may use a stool softener, such as Colace (over the counter) 100 mg twice a day.  Use MiraLax (over the counter) for constipation as needed.   Diet - low sodium heart healthy    Complete by:  As directed    Discharge instructions    Complete by:  As directed    -maintain dressing to right knee for 3 days, then remove and replace with clean and dry dressing.  Ok to shower once dressing is removed, but do not submerge under water until sutures removed in clinic -ok for full weight bearing to the right knee -for prevention of blood clots take one 81 mg asa daily for 4 weeks   Face-to-face encounter (required for Medicare/Medicaid patients)    Complete by:  As directed    I Yolonda Kida certify that this patient is under my care and that I, or a nurse practitioner or physician's assistant working with me, had a face-to-face encounter that meets the physician face-to-face encounter requirements with this patient on 07/14/2016. The encounter with the patient was in whole, or in part for the following medical condition(s) which is the primary reason for home health care (List medical condition): extreme mobility deficit and fall risk.   The encounter with the patient was in whole, or in part, for the following medical condition, which is the primary reason for home health care:  decreased mobility   I certify that, based on my findings, the following services are medically necessary home health services:  Physical therapy   Reason for  Medically Necessary Home Health Services:  Therapy- Therapeutic Exercises to Increase Strength and Endurance   My clinical findings support the need for the above services:  Unable to leave home safely without assistance and/or assistive device   Further, I certify that my clinical findings support that this patient is homebound due to:  Unable to leave home safely without assistance   Home Health    Complete by:  As directed    To provide the following care/treatments:  PT   Increase activity slowly as tolerated    Complete by:  As directed      Allergies as of 07/14/2016      Reactions   Hydrocodone Itching      Medication List    TAKE these medications  albuterol 108 (90 Base) MCG/ACT inhaler Commonly known as:  PROVENTIL HFA;VENTOLIN HFA Inhale 1-2 puffs into the lungs every 6 (six) hours as needed for wheezing or shortness of breath.   aspirin EC 81 MG tablet Take 1 tablet (81 mg total) by mouth daily.   cholecalciferol 1000 units tablet Commonly known as:  VITAMIN D Take 1,000 Units by mouth daily.   cyclobenzaprine 10 MG tablet Commonly known as:  FLEXERIL Take 5-10 mg by mouth at bedtime as needed for muscle spasms.   esomeprazole 40 MG capsule Commonly known as:  NEXIUM Take 80 mg by mouth 2 (two) times daily before a meal.   gabapentin 400 MG capsule Commonly known as:  NEURONTIN Take 400-800 mg by mouth 3 (three) times daily. Pt takes one capsule at breakfast and lunch, and two at bedtime.   HYDROcodone-acetaminophen 5-325 MG tablet Commonly known as:  NORCO/VICODIN Take 1 tablet by mouth every 4 (four) hours as needed for moderate pain.   ibuprofen 800 MG tablet Commonly known as:  ADVIL,MOTRIN Take 800 mg by mouth 3 (three) times daily.   LORazepam 1 MG tablet Commonly known as:  ATIVAN Take 1 mg by mouth at bedtime.   ondansetron 4 MG disintegrating tablet Commonly known as:  ZOFRAN ODT Take 1 tablet (4 mg total) by mouth every 8 (eight) hours as  needed for nausea or vomiting.   oxyCODONE 5 MG immediate release tablet Commonly known as:  ROXICODONE Take 1 tablet (5 mg total) by mouth every 4 (four) hours as needed for severe pain.   traZODone 50 MG tablet Commonly known as:  DESYREL Take 100 mg by mouth at bedtime.      Follow-up Information    Yolonda KidaJason Patrick Tyjanae Bartek, MD. Schedule an appointment as soon as possible for a visit in 2 week(s).   Specialty:  Orthopedic Surgery Why:  For suture removal Contact information: [address] [place] [id number] [phone]        [facility] Follow up.   Specialty:  Home Health Services Why:  home health physical therapy Contact information: [address] [place] [address] [phone]           Signed: Maryan RuedJason P Genevieve Arbaugh, MD Orthopaedic Surgery 07/26/2016, 11:39 AM

## 2016-11-16 NOTE — Addendum Note (Signed)
Addendum  created 11/16/16 1718 by Camylle Whicker, MD   Sign clinical note    

## 2017-02-03 ENCOUNTER — Ambulatory Visit: Payer: Managed Care, Other (non HMO) | Admitting: Sports Medicine

## 2017-05-11 DIAGNOSIS — M25562 Pain in left knee: Secondary | ICD-10-CM | POA: Insufficient documentation

## 2017-05-15 ENCOUNTER — Other Ambulatory Visit: Payer: Self-pay | Admitting: Orthopedic Surgery

## 2017-05-15 DIAGNOSIS — M25562 Pain in left knee: Secondary | ICD-10-CM

## 2017-05-21 ENCOUNTER — Ambulatory Visit
Admission: RE | Admit: 2017-05-21 | Discharge: 2017-05-21 | Disposition: A | Discharge status: Home / Self Care | Payer: 59 | Source: Ambulatory Visit | Attending: Orthopedic Surgery | Admitting: Orthopedic Surgery

## 2017-05-21 DIAGNOSIS — M25562 Pain in left knee: Secondary | ICD-10-CM

## 2017-05-25 ENCOUNTER — Other Ambulatory Visit: Payer: Self-pay | Admitting: Rehabilitation

## 2017-05-25 DIAGNOSIS — M47812 Spondylosis without myelopathy or radiculopathy, cervical region: Secondary | ICD-10-CM

## 2017-06-08 ENCOUNTER — Ambulatory Visit
Admission: RE | Admit: 2017-06-08 | Discharge: 2017-06-08 | Disposition: A | Discharge status: Home / Self Care | Payer: 59 | Source: Ambulatory Visit | Attending: Rehabilitation | Admitting: Rehabilitation

## 2017-06-08 DIAGNOSIS — M47812 Spondylosis without myelopathy or radiculopathy, cervical region: Secondary | ICD-10-CM

## 2017-08-24 ENCOUNTER — Ambulatory Visit: Payer: 59 | Admitting: Allergy

## 2017-08-26 DIAGNOSIS — M2241 Chondromalacia patellae, right knee: Secondary | ICD-10-CM | POA: Insufficient documentation

## 2017-09-21 DIAGNOSIS — Z9889 Other specified postprocedural states: Secondary | ICD-10-CM | POA: Insufficient documentation

## 2017-10-05 DIAGNOSIS — M7062 Trochanteric bursitis, left hip: Secondary | ICD-10-CM | POA: Insufficient documentation

## 2017-10-05 DIAGNOSIS — M722 Plantar fascial fibromatosis: Secondary | ICD-10-CM | POA: Insufficient documentation

## 2017-10-05 DIAGNOSIS — M2669 Other specified disorders of temporomandibular joint: Secondary | ICD-10-CM | POA: Insufficient documentation

## 2017-10-12 ENCOUNTER — Other Ambulatory Visit: Payer: Self-pay | Admitting: Orthopaedic Surgery

## 2017-10-12 DIAGNOSIS — M5416 Radiculopathy, lumbar region: Secondary | ICD-10-CM

## 2017-10-13 ENCOUNTER — Ambulatory Visit
Admission: RE | Admit: 2017-10-13 | Discharge: 2017-10-13 | Disposition: A | Discharge status: Home / Self Care | Payer: 59 | Source: Ambulatory Visit | Attending: Orthopaedic Surgery | Admitting: Orthopaedic Surgery

## 2017-10-13 DIAGNOSIS — M5416 Radiculopathy, lumbar region: Secondary | ICD-10-CM

## 2017-10-27 ENCOUNTER — Other Ambulatory Visit: Payer: Self-pay | Admitting: Orthopaedic Surgery

## 2017-10-27 DIAGNOSIS — M5416 Radiculopathy, lumbar region: Secondary | ICD-10-CM

## 2017-10-28 ENCOUNTER — Other Ambulatory Visit: Payer: 59

## 2017-11-15 DIAGNOSIS — B370 Candidal stomatitis: Secondary | ICD-10-CM | POA: Insufficient documentation

## 2017-12-02 DIAGNOSIS — N951 Menopausal and female climacteric states: Secondary | ICD-10-CM | POA: Insufficient documentation

## 2017-12-02 DIAGNOSIS — F339 Major depressive disorder, recurrent, unspecified: Secondary | ICD-10-CM | POA: Insufficient documentation

## 2017-12-02 DIAGNOSIS — K219 Gastro-esophageal reflux disease without esophagitis: Secondary | ICD-10-CM | POA: Insufficient documentation

## 2017-12-02 DIAGNOSIS — F4322 Adjustment disorder with anxiety: Secondary | ICD-10-CM | POA: Insufficient documentation

## 2017-12-21 DIAGNOSIS — M9904 Segmental and somatic dysfunction of sacral region: Secondary | ICD-10-CM | POA: Insufficient documentation

## 2018-02-12 DIAGNOSIS — N912 Amenorrhea, unspecified: Secondary | ICD-10-CM | POA: Insufficient documentation

## 2018-02-12 DIAGNOSIS — N951 Menopausal and female climacteric states: Secondary | ICD-10-CM | POA: Insufficient documentation

## 2018-02-12 DIAGNOSIS — R5383 Other fatigue: Secondary | ICD-10-CM | POA: Insufficient documentation

## 2018-02-12 DIAGNOSIS — M79672 Pain in left foot: Secondary | ICD-10-CM | POA: Insufficient documentation

## 2018-03-02 DIAGNOSIS — M24572 Contracture, left ankle: Secondary | ICD-10-CM | POA: Insufficient documentation

## 2018-03-09 ENCOUNTER — Ambulatory Visit: Payer: 59 | Admitting: Neurology

## 2018-03-09 ENCOUNTER — Encounter: Payer: Self-pay | Admitting: Neurology

## 2018-03-09 ENCOUNTER — Encounter

## 2018-03-09 VITALS — BP 110/68 | HR 61 | Ht 66.5 in | Wt 165.8 lb

## 2018-03-09 DIAGNOSIS — G8929 Other chronic pain: Secondary | ICD-10-CM | POA: Insufficient documentation

## 2018-03-09 DIAGNOSIS — M5442 Lumbago with sciatica, left side: Secondary | ICD-10-CM

## 2018-03-09 NOTE — Progress Notes (Signed)
PATIENT: Carly Gonzalez DOB: 11/16/1965  Chief Complaint  Patient presents with  . Back Pain    She is here with her husband, Carly Gonzalez, to have her low back pain further evaluated.  States the pain radiates down her left leg into her left foot, along with numbness and tingling.  Occasionally, she will have these sensations in her right leg.  . Orthopaedics    Venita Lick, MD- referring MD  . PCP    Hadley Pen, MD     HISTORICAL  Carly Gonzalez is a 52 years old female, seen in request by her orthopedic surgeon Dr. Venita Lick, and her primary care physician Dr. Keturah Barre for evaluation of low back pain.  Initial evaluation was on March 09, 2018.  She is accompanied by her husband Carly Gonzalez at today's clinical visit.  I have reviewed and summarized the referring note from the referring physician.  I saw her initially in December 2016 for migraine headaches.  She had multiple right leg surgery since 2008 following a fall injury, she suffered right ankle, tibial, fibular fracture, went through in his physical therapy, fell multiple times during that period of time to the concrete floor, bumped her head at the corner of cabinet, with episode of loss of consciousness, then she began to have frequent headaches, often starting at the left occipital region spreading forward to become on left retro-orbital area pressure pain sometimes throbbing, with associated light noise sensitivity, nauseous, it can last few hours to 1 day,  Previously she was treated with amitriptyline which has been helpful  From 2010 to 2015, she was almost headache free, was able to taper off amitriptyline, since May 2016, after she started to work, she feels stressful, she also began to notice increased headaches, with similar patterns, left retro-orbital area severe pounding migraine headaches, she is now having 4-5 migraine headaches each week, she was put back on amitriptyline 25 mg every night,  was able to tolerated well, but there is no significant improvement in her headaches, she was also given Imitrex tablet as needed, which help some,  She also complains of mild memory trouble, difficulty focusing  She continue have migraine about once a week, starting from left neck, spreading forward. Today her main concern is worsening low back pain since she fell in October 2017, she was getting up from her patio, her slipper was caught into space, she fell face down, landed on bilateral knees, she has been off work since then, there was also episode of her knees give out underneath her, recurrent falling episode, this has caused severe low back pain, diffuse body achy pain, now she complains of worsening left-sided low back pain, shooting pain to left leg, left leg turns when she walks, gait abnormality, left foot numbness, frequent urination, occasional urinary incontinence.  Personally reviewed MRI of lumbar spine in June 2019, there was mild narrowing of the lateral recess at L4-5, no evidence of canal or significant foraminal stenosis.  MRI of cervical spine in February 2019: No acute abnormality. MRI of left knee, in January 2019, edema in the superficial soft tissue overlying the lateral patellofemoral ligament consistent with soft tissue contusion otherwise normal examination,   REVIEW OF SYSTEMS: Full 14 system review of systems performed and notable only for weight gain, fatigue, swelling legs, cough, feeling hot, cold, increased thirst, flushing, joint pain, allergy, easy bruising, memory loss, numbness, weakness, insomnia, restless leg, anxiety decreased energy All other review of systems were negative.  ALLERGIES:  Allergies  Allergen Reactions  . Hydrocodone Itching    HOME MEDICATIONS: Current Outpatient Medications  Medication Sig Dispense Refill  . amoxicillin-clavulanate (AUGMENTIN) 875-125 MG tablet 03/09/18 - last dose will taken on 03/10/18  0  . gabapentin (NEURONTIN)  400 MG capsule Take 400 mg by mouth 3 (three) times daily.     Marland Kitchen ibuprofen (ADVIL,MOTRIN) 600 MG tablet TK 1 T PO Q 8 HOURS PRN  0  . LORazepam (ATIVAN) 0.5 MG tablet TK 1 T PO BID  1  . montelukast (SINGULAIR) 10 MG tablet Take by mouth.    . pantoprazole (PROTONIX) 40 MG tablet Take 1 tablet by mouth daily.    . predniSONE (STERAPRED UNI-PAK 21 TAB) 10 MG (21) TBPK tablet 03/09/18 - she is taking her last dose today.  0  . traZODone (DESYREL) 100 MG tablet trazodone 100 mg tablet  TK 1/2 TO 1 T PO Q NIGHT     No current facility-administered medications for this visit.     PAST MEDICAL HISTORY: Past Medical History:  Diagnosis Date  . Anxiety   . Arthritis   . Chondromalacia of lateral tibial plateau, right   . Depression   . Early menopause occurring in patient age younger than 45 years   . Family history of adverse reaction to anesthesia    MOTHER-- SEVERE PONV  . GAD (generalized anxiety disorder)   . History of concussion    x4  per pt from 10/ 2010 to 2011 due to right leg numbness/ weakness post injury, leg would give way fall and hit her head--- per pt residual decreased eye sight , corrected w/ glasses,  and mild short-term memory issues  . Insomnia   . Low back pain   . Numbness of right foot    right ankle/foot  post injury 2010  . Pinched vertebral nerve    lumbar region from fall 02-02-2016-- pain down right hip/ goin --  uses heating pad  . PONV (postoperative nausea and vomiting)    AND ANXIETY  . Poor short term memory    post concussion  . Right leg weakness    post injury  . Seasonal asthma   . Synovitis of right knee   . 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   left side recently irritated from tooth extraction due to abscess and bone infection (wk of 07-01-2016)  . Wears glasses     PAST SURGICAL HISTORY: Past Surgical History:  Procedure Laterality Date  . CESAREAN SECTION  1994  . 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 KNEE ARTHROSCOPY Right 02/2010  . KNEE ARTHROSCOPY Right 07/13/2016   Procedure: Right knee arthroscopy with limited synovectomy and chondroplasty medial femeral chondial, lateral tibia plateau ;  Surgeon: Yolonda Kida, MD;  Location: [facility];  Service: Orthopedics;  Laterality: Right;  . ORIF RIGHT TIB-FIB FRACTURES  03-01-2009   at [facility]  . ORIF RIGHT TIBIA FX (PROVISIONAL) IM NAILING/ REMOVAL DEEP IMPLANTS MEDIAL TIBIA/ TAKEDOWN NONUNION SITE  08/13/2009  . TONSILLECTOMY  2010    FAMILY HISTORY: Family History  Problem Relation Age of Onset  . Heart attack Father   . Anxiety disorder Father   . Vascular Disease Mother   . Diabetes Mother   . Breast cancer Maternal Grandmother     SOCIAL HISTORY: Social History   Socioeconomic History  . Marital status: Married    Spouse name: Not on  file  . Number of children: 2  . Years of education: College  . Highest education level: Associate degree: academic program  Occupational History  . Occupation: Unemployed  Social Needs  . Financial resource strain: Not on file  . Food insecurity:    Worry: Not on file    Inability: Not on file  . Transportation needs:    Medical: Not on file    Non-medical: Not on file  Tobacco Use  . Smoking status: Current Every Day Smoker    Packs/day: 0.25    Types: Cigarettes  . Smokeless tobacco: Never Used  . Tobacco comment: smokes 3 cig's daily  Substance and Sexual Activity  . Alcohol use: Yes    Alcohol/week: 0.0 standard drinks    Comment: once per month  . Drug use: No  . Sexual activity: Not on file  Lifestyle  . Physical activity:    Days per week: Not on file    Minutes per session: Not on file  . Stress: Not on file  Relationships  . Social connections:    Talks on phone: Not on file    Gets together: Not on file    Attends religious service: Not on file    Active member of club or organization:  Not on file    Attends meetings of clubs or organizations: Not on file    Relationship status: Not on file  . Intimate partner violence:    Fear of current or ex partner: Not on file    Emotionally abused: Not on file    Physically abused: Not on file    Forced sexual activity: Not on file  Other Topics Concern  . Not on file  Social History Narrative   Lives at home with husband and daughter.   Left-handed.   No caffeine use.     PHYSICAL EXAM   Vitals:   03/09/18 0744  BP: 110/68  Pulse: 61  Weight: 165 lb 12 oz (75.2 kg)  Height: 5' 6.5" (1.689 m)    Not recorded      Body mass index is 26.35 kg/m.  PHYSICAL EXAMNIATION: Depressed looking middle-aged female  Gen: NAD, conversant, well nourised, obese, well groomed                     Cardiovascular: Regular rate rhythm, no peripheral edema, warm, nontender. Eyes: Conjunctivae clear without exudates or hemorrhage Neck: Supple, no carotid bruits. Pulmonary: Clear to auscultation bilaterally   NEUROLOGICAL EXAM:  MENTAL STATUS: Speech:    Speech is normal; fluent and spontaneous with normal comprehension.  Cognition:     Orientation to time, place and person     Normal recent and remote memory     Normal Attention span and concentration     Normal Language, naming, repeating,spontaneous speech     Fund of knowledge   CRANIAL NERVES: CN II: Visual fields are full to confrontation. Fundoscopic exam is normal with sharp discs and no vascular changes. Pupils are round equal and briskly reactive to light. CN III, IV, VI: extraocular movement are normal. No ptosis. CN V: Facial sensation is intact to pinprick in all 3 divisions bilaterally. Corneal responses are intact.  CN VII: Face is symmetric with normal eye closure and smile. CN VIII: Hearing is normal to rubbing fingers CN IX, X: Palate elevates symmetrically. Phonation is normal. CN XI: Head turning and shoulder shrug are intact CN XII: Tongue is midline  with normal movements and no atrophy.  MOTOR: There is no pronator drift of out-stretched arms. Muscle bulk and tone are normal. Muscle strength is normal.  REFLEXES: Reflexes are 2+ and symmetric at the biceps, triceps, knees, and ankles. Plantar responses are flexor.  SENSORY: Intact to light touch, pinprick, positional sensation and vibratory sensation are intact in fingers and toes.  COORDINATION: Rapid alternating movements and fine finger movements are intact. There is no dysmetria on finger-to-nose and heel-knee-shin.    GAIT/STANCE: Antalgic, cautious,   DIAGNOSTIC DATA (LABS, IMAGING, TESTING) - I reviewed patient records, labs, notes, testing and imaging myself where available.   ASSESSMENT AND PLAN  Lemya Greenwell is a 52 y.o. female   Chronic low back pain,  Essentially normal neurological examinations,  No significant abnormality on MRI of the lumbar spine  EMG nerve conduction study with her complaints of left foot numbness rule out peripheral neuropathy  Referral to physical therapy  Chronic migraine headaches  Continue gabapentin 400 mg 3 times a day as migraine prevention  NSAIDs as needed  Levert Feinstein, M.D. Ph.D.  [facility] [address], [address] [place], [place] [address] Ph: [phone] Fax: [phone]  CC: Referring Provider

## 2018-04-06 ENCOUNTER — Encounter: Payer: 59 | Admitting: Neurology

## 2018-04-30 DIAGNOSIS — J189 Pneumonia, unspecified organism: Secondary | ICD-10-CM | POA: Insufficient documentation

## 2018-05-16 ENCOUNTER — Ambulatory Visit (INDEPENDENT_AMBULATORY_CARE_PROVIDER_SITE_OTHER): Payer: 59 | Admitting: Neurology

## 2018-05-16 ENCOUNTER — Encounter

## 2018-05-16 ENCOUNTER — Ambulatory Visit: Payer: 59 | Admitting: Neurology

## 2018-05-16 DIAGNOSIS — M5442 Lumbago with sciatica, left side: Principal | ICD-10-CM

## 2018-05-16 DIAGNOSIS — R202 Paresthesia of skin: Secondary | ICD-10-CM | POA: Insufficient documentation

## 2018-05-16 DIAGNOSIS — Z0289 Encounter for other administrative examinations: Secondary | ICD-10-CM

## 2018-05-16 DIAGNOSIS — G8929 Other chronic pain: Secondary | ICD-10-CM

## 2018-05-16 NOTE — Procedures (Signed)
Full Name: Carly Gonzalez Gender: Female MRN #: [id number] Date of Birth: 1965/08/01    Visit Date: 05/16/2018 13:16 Age: 54 Years 4 Months Old Examining Physician: Levert Feinstein, MD  Referring Physician: Levert Feinstein, MD History:   53 years old female presented with left foot numbness, chronic low back pain  Summary of the tests:  Nerve conduction study: Bilateral sural, superficial peroneal sensory responses were normal.  Bilateral tibial motor responses were normal.  Right peroneal to EDB motor responses were normal. Left peroneal to EDB motor response showed significantly decreased the C map amplitude, with normal distal latency, conduction velocity.  Electromyography: Selected needle examination of left lower extremity muscles were normal.  Conclusion: This is an abnormal study.  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    ------------------------------- Levert Feinstein, M.D. PhD  [facility] [address] [place], [place] [address] Tel: [phone] Fax: [phone]        [facility]    Nerve / Sites Muscle Latency Ref. Amplitude Ref. Rel Amp Segments Distance Velocity Ref. Area    ms ms mV mV %  cm m/s m/s mVms  L Peroneal - EDB     Ankle EDB 4.2 ?6.5 0.9 ?2.0 100 Ankle - EDB 9   3.4     Fib head EDB 10.9  0.7  76.7 Fib head - Ankle 30 45 ?44 2.4     Pop fossa EDB 12.7  0.8  110 Pop fossa - Fib head 10 55 ?44 3.7         Pop fossa - Ankle      R Peroneal - EDB     Ankle EDB 4.7 ?6.5 3.4 ?2.0 100 Ankle - EDB 9   12.9     Fib head EDB 11.0  3.1  90.4 Fib head - Ankle 30 48 ?44 12.1     Pop fossa EDB 13.0  2.8  91.9 Pop fossa - Fib head 10 51 ?44 11.2         Pop fossa - Ankle      L Tibial - AH     Ankle AH 3.5 ?5.8 13.4 ?4.0 100 Ankle - AH 9   32.5     Pop fossa AH 11.8  9.2  68.7 Pop fossa - Ankle 35 42 ?41 27.7  R  Tibial - AH     Ankle AH 4.1 ?5.8 8.5 ?4.0 100 Ankle - AH 9   19.8     Pop fossa AH 11.6  7.4  87.6 Pop fossa - Ankle 36 48 ?41 28.0             SNC    Nerve / Sites Rec. Site Peak Lat Ref.  Amp Ref. Segments Distance    ms ms V V  cm  L Sural - Ankle (Calf)     Calf Ankle 3.5 ?4.4 6 ?6 Calf - Ankle 14  R Sural - Ankle (Calf)     Calf Ankle 3.4 ?4.4 8 ?6 Calf - Ankle 14  L Superficial peroneal - Ankle     Lat leg Ankle 3.8 ?4.4 7 ?6 Lat leg - Ankle 14  R Superficial peroneal - Ankle     Lat leg Ankle 3.8 ?4.4 9 ?6 Lat leg - Ankle 14              F  Wave    Nerve F Lat Ref.   ms ms  L Tibial - AH 53.6 ?56.0  R Tibial - AH 52.7 ?56.0         EMG full       EMG Summary Table    Spontaneous MUAP Recruitment  Muscle IA Fib PSW Fasc Other Amp Dur. Poly Pattern  L. Tibialis anterior Normal None None None _______ Normal Normal Normal Normal  L. Peroneus longus Normal None None None _______ Normal Normal Normal Normal  L. Tibialis posterior Normal None None None _______ Normal Normal Normal Normal  L. Gastrocnemius (Medial head) Normal None None None _______ Normal Normal Normal Normal  L. Vastus lateralis Normal None None None _______ Normal Normal Normal Normal

## 2018-05-31 ENCOUNTER — Other Ambulatory Visit: Payer: Self-pay | Admitting: Orthopedic Surgery

## 2018-05-31 ENCOUNTER — Ambulatory Visit
Admission: RE | Admit: 2018-05-31 | Discharge: 2018-05-31 | Disposition: A | Discharge status: Home / Self Care | Payer: 59 | Source: Ambulatory Visit | Attending: Orthopedic Surgery | Admitting: Orthopedic Surgery

## 2018-05-31 DIAGNOSIS — M2170 Unequal limb length (acquired), unspecified site: Secondary | ICD-10-CM | POA: Insufficient documentation

## 2018-05-31 DIAGNOSIS — M7632 Iliotibial band syndrome, left leg: Secondary | ICD-10-CM

## 2018-06-11 DIAGNOSIS — G4762 Sleep related leg cramps: Secondary | ICD-10-CM | POA: Insufficient documentation

## 2018-06-11 DIAGNOSIS — I889 Nonspecific lymphadenitis, unspecified: Secondary | ICD-10-CM | POA: Insufficient documentation

## 2018-06-28 DIAGNOSIS — R609 Edema, unspecified: Secondary | ICD-10-CM | POA: Insufficient documentation

## 2018-07-04 DIAGNOSIS — Z0271 Encounter for disability determination: Secondary | ICD-10-CM

## 2018-07-05 DIAGNOSIS — M6752 Plica syndrome, left knee: Secondary | ICD-10-CM | POA: Insufficient documentation

## 2018-07-18 DIAGNOSIS — E663 Overweight: Secondary | ICD-10-CM | POA: Insufficient documentation

## 2018-07-18 DIAGNOSIS — J3089 Other allergic rhinitis: Secondary | ICD-10-CM | POA: Insufficient documentation

## 2018-08-10 ENCOUNTER — Telehealth: Payer: Self-pay | Admitting: *Deleted

## 2018-08-10 NOTE — Telephone Encounter (Signed)
Plan of care order from [facility] in [place] signed by Dr. Epimenio Foot in Dr. Zannie Cove absence.  Fax confirmation received [phone], [phone].

## 2018-12-09 DIAGNOSIS — M216X1 Other acquired deformities of right foot: Secondary | ICD-10-CM | POA: Insufficient documentation

## 2019-01-17 DIAGNOSIS — M7671 Peroneal tendinitis, right leg: Secondary | ICD-10-CM | POA: Insufficient documentation

## 2019-01-17 DIAGNOSIS — M6701 Short Achilles tendon (acquired), right ankle: Secondary | ICD-10-CM | POA: Insufficient documentation

## 2019-02-02 DIAGNOSIS — J0100 Acute maxillary sinusitis, unspecified: Secondary | ICD-10-CM | POA: Insufficient documentation

## 2019-03-01 DIAGNOSIS — M21371 Foot drop, right foot: Secondary | ICD-10-CM | POA: Insufficient documentation

## 2019-03-23 DIAGNOSIS — F5104 Psychophysiologic insomnia: Secondary | ICD-10-CM | POA: Insufficient documentation

## 2019-03-23 DIAGNOSIS — R5383 Other fatigue: Secondary | ICD-10-CM | POA: Insufficient documentation

## 2019-03-23 DIAGNOSIS — F410 Panic disorder [episodic paroxysmal anxiety] without agoraphobia: Secondary | ICD-10-CM | POA: Insufficient documentation

## 2019-03-23 DIAGNOSIS — E559 Vitamin D deficiency, unspecified: Secondary | ICD-10-CM | POA: Insufficient documentation

## 2019-03-23 DIAGNOSIS — Z13220 Encounter for screening for lipoid disorders: Secondary | ICD-10-CM | POA: Insufficient documentation

## 2019-03-23 DIAGNOSIS — J40 Bronchitis, not specified as acute or chronic: Secondary | ICD-10-CM | POA: Insufficient documentation

## 2019-03-23 DIAGNOSIS — R5381 Other malaise: Secondary | ICD-10-CM | POA: Insufficient documentation

## 2019-03-27 DIAGNOSIS — S40011A Contusion of right shoulder, initial encounter: Secondary | ICD-10-CM | POA: Insufficient documentation

## 2019-03-27 DIAGNOSIS — S40021A Contusion of right upper arm, initial encounter: Secondary | ICD-10-CM | POA: Insufficient documentation

## 2019-03-27 DIAGNOSIS — M25511 Pain in right shoulder: Secondary | ICD-10-CM | POA: Insufficient documentation

## 2019-03-27 DIAGNOSIS — E782 Mixed hyperlipidemia: Secondary | ICD-10-CM | POA: Insufficient documentation

## 2019-03-31 DIAGNOSIS — J029 Acute pharyngitis, unspecified: Secondary | ICD-10-CM | POA: Insufficient documentation

## 2019-03-31 DIAGNOSIS — R058 Other specified cough: Secondary | ICD-10-CM | POA: Insufficient documentation

## 2019-03-31 DIAGNOSIS — Z20822 Contact with and (suspected) exposure to covid-19: Secondary | ICD-10-CM | POA: Insufficient documentation

## 2019-04-07 DIAGNOSIS — J988 Other specified respiratory disorders: Secondary | ICD-10-CM | POA: Insufficient documentation

## 2019-04-19 ENCOUNTER — Encounter: Payer: 59 | Admitting: Neurology

## 2019-05-07 IMAGING — DX DG BONE LENGTH
1 series · 1 of 1 positions shown · non-contrast
Comparison: None.

CLINICAL DATA: Left leg IT band syndrome. Right leg appears shorter
in length.

EXAM:
BONE LENGTH

[dg leg length]
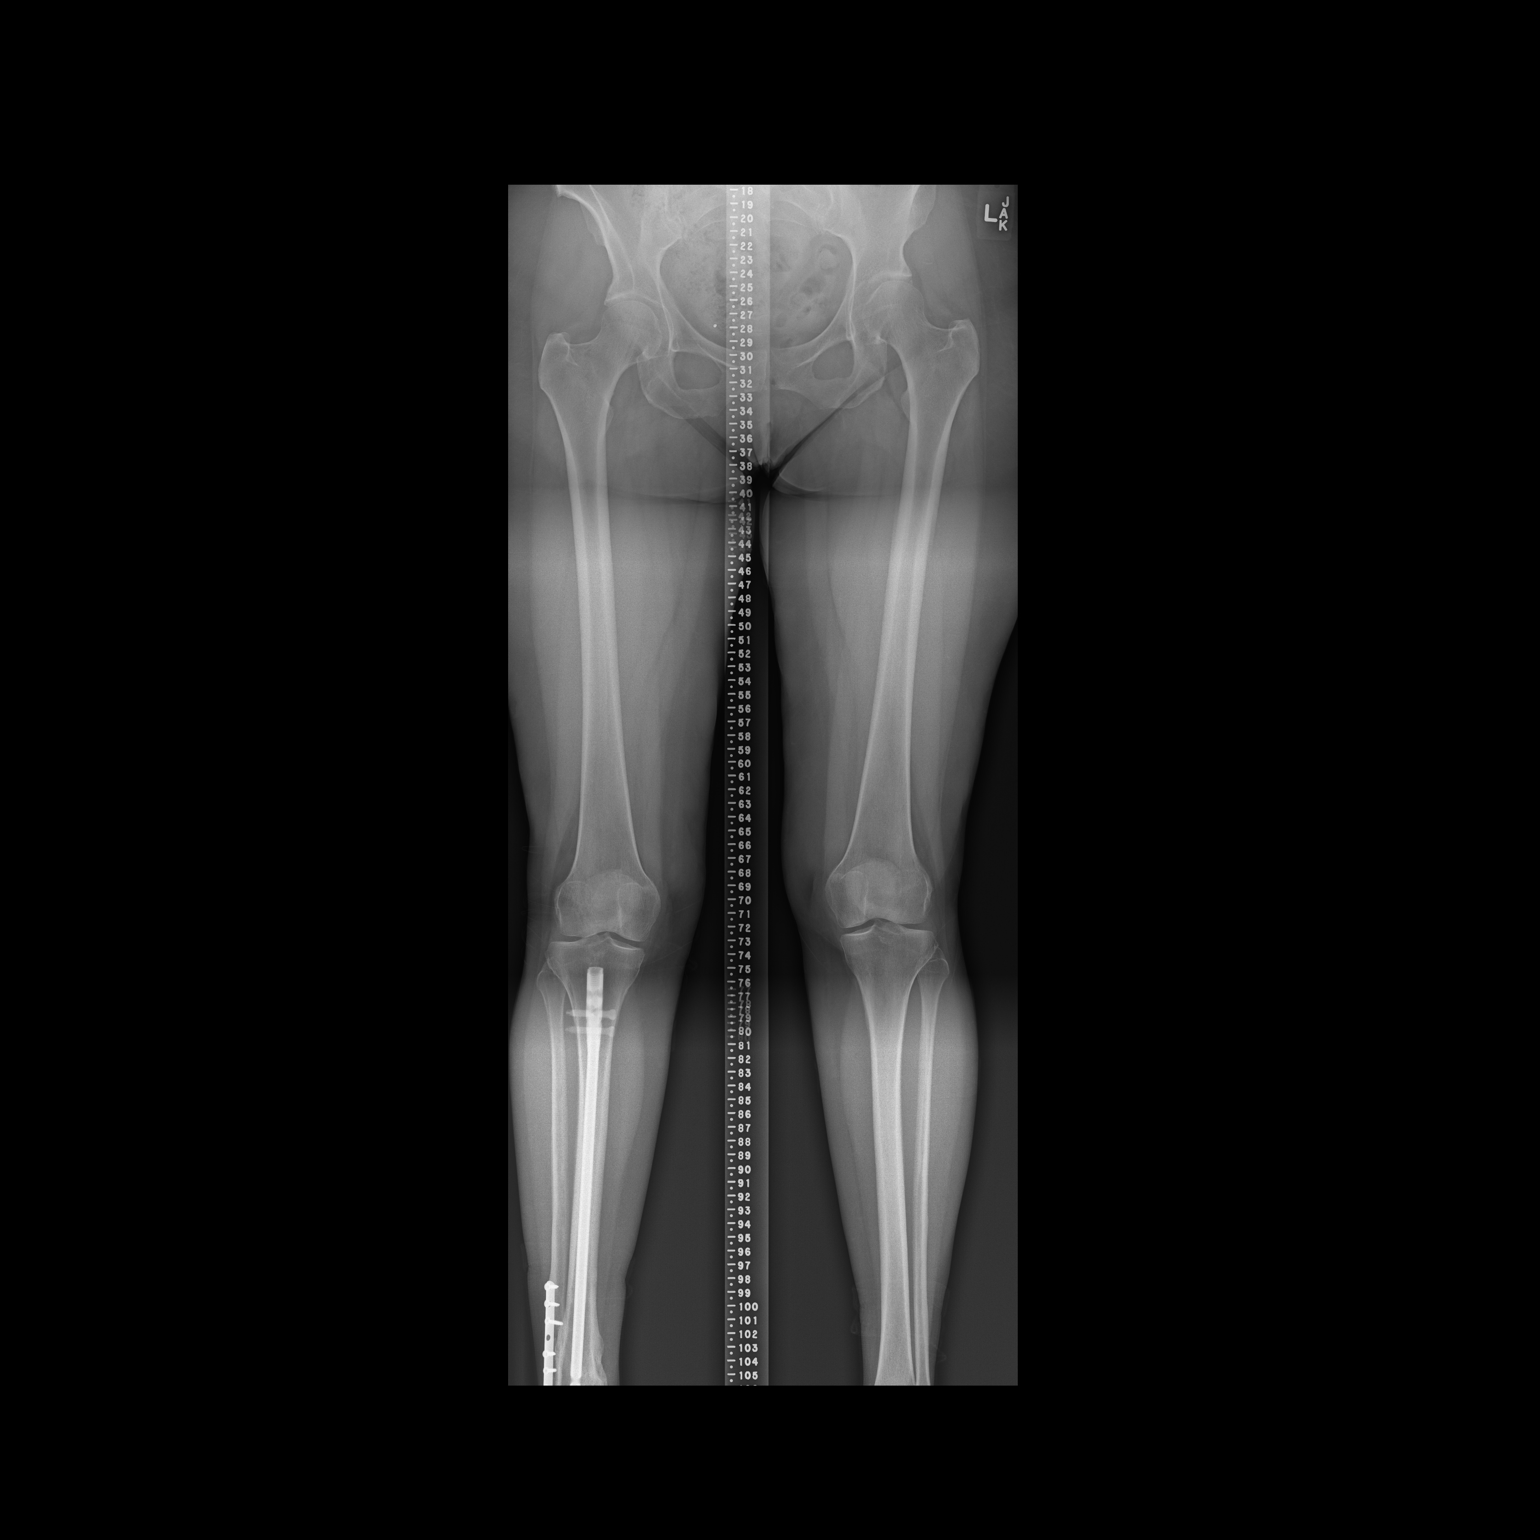

[1 of 1 positions shown; findings below may reference images not displayed]

FINDINGS: Leg length is measured from the proximal end of femoral head to the
center of tibial plafond. Left leg measures 84.9 cm and the right
leg 84.2 cm with a difference of 0.7 cm.

Angle between femoral mechanical axis and tibial mechanical axis is
1 degree valgus on the right and 4 degrees valgus on the left.

Healed right distal tibia fracture status post intramedullary male.
Healed distal fibular fracture status post lateral plate and screw
fixation.
IMPRESSION: Minimal leg length discrepancy.

## 2019-06-14 ENCOUNTER — Encounter: Payer: 59 | Admitting: Neurology

## 2019-06-14 ENCOUNTER — Encounter: Payer: Self-pay | Admitting: Neurology

## 2019-06-14 ENCOUNTER — Telehealth: Payer: Self-pay | Admitting: *Deleted

## 2019-06-14 NOTE — Telephone Encounter (Signed)
No showed NCV/EMG appointment. 

## 2019-08-22 DIAGNOSIS — I8312 Varicose veins of left lower extremity with inflammation: Secondary | ICD-10-CM | POA: Insufficient documentation

## 2019-08-22 DIAGNOSIS — I8311 Varicose veins of right lower extremity with inflammation: Secondary | ICD-10-CM | POA: Insufficient documentation

## 2019-10-27 DIAGNOSIS — M222X2 Patellofemoral disorders, left knee: Secondary | ICD-10-CM | POA: Insufficient documentation

## 2019-10-27 DIAGNOSIS — M5412 Radiculopathy, cervical region: Secondary | ICD-10-CM | POA: Insufficient documentation

## 2020-04-17 DIAGNOSIS — Z9189 Other specified personal risk factors, not elsewhere classified: Secondary | ICD-10-CM | POA: Insufficient documentation

## 2020-07-19 DIAGNOSIS — T887XXA Unspecified adverse effect of drug or medicament, initial encounter: Secondary | ICD-10-CM | POA: Insufficient documentation

## 2020-07-31 DIAGNOSIS — S43439A Superior glenoid labrum lesion of unspecified shoulder, initial encounter: Secondary | ICD-10-CM | POA: Insufficient documentation

## 2020-10-07 DIAGNOSIS — M2170 Unequal limb length (acquired), unspecified site: Secondary | ICD-10-CM | POA: Insufficient documentation

## 2020-12-19 DIAGNOSIS — S161XXA Strain of muscle, fascia and tendon at neck level, initial encounter: Secondary | ICD-10-CM | POA: Insufficient documentation

## 2021-01-07 DIAGNOSIS — H538 Other visual disturbances: Secondary | ICD-10-CM | POA: Insufficient documentation

## 2021-02-03 DIAGNOSIS — K5900 Constipation, unspecified: Secondary | ICD-10-CM | POA: Insufficient documentation

## 2021-02-03 DIAGNOSIS — R1310 Dysphagia, unspecified: Secondary | ICD-10-CM | POA: Insufficient documentation

## 2021-03-04 ENCOUNTER — Ambulatory Visit (INDEPENDENT_AMBULATORY_CARE_PROVIDER_SITE_OTHER): Payer: Medicare Other

## 2021-03-04 ENCOUNTER — Other Ambulatory Visit: Payer: Self-pay

## 2021-03-04 ENCOUNTER — Encounter: Payer: Self-pay | Admitting: Sports Medicine

## 2021-03-04 ENCOUNTER — Ambulatory Visit: Payer: Medicare Other | Admitting: Sports Medicine

## 2021-03-04 DIAGNOSIS — M21621 Bunionette of right foot: Secondary | ICD-10-CM

## 2021-03-04 DIAGNOSIS — L84 Corns and callosities: Secondary | ICD-10-CM | POA: Diagnosis not present
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
## 2021-03-12 ENCOUNTER — Other Ambulatory Visit: Payer: Self-pay | Admitting: Sports Medicine

## 2021-03-12 DIAGNOSIS — M21621 Bunionette of right foot: Secondary | ICD-10-CM

## 2023-01-21 ENCOUNTER — Ambulatory Visit: Payer: Medicare Other | Admitting: Psychiatry

## 2023-02-04 ENCOUNTER — Ambulatory Visit (INDEPENDENT_AMBULATORY_CARE_PROVIDER_SITE_OTHER): Payer: Medicare Other | Admitting: Psychiatry

## 2023-02-04 DIAGNOSIS — F411 Generalized anxiety disorder: Secondary | ICD-10-CM | POA: Diagnosis not present

## 2023-02-04 NOTE — Progress Notes (Signed)
[facility] Counselor Initial Adult Exam  Name: Carly Gonzalez Date: 02/04/2023 MRN: [id number] DOB: July 19, 1965 PCP: Hadley Pen, MD  Time spent: 60 minutes   Guardian/Payee:  Patient    Paperwork requested:  No   Reason for Visit /Presenting Problem:  anxiety, sometime panic,depression, "My mom passed away very tragically last yr and we were very close"; unresolved grief  Mental Status Exam:    Appearance:   Casual and Neat     Behavior:  Appropriate, Sharing, and Motivated  Motor:  Normal  Speech/Language:   Clear and Coherent  Affect:  Depressed, Tearful, and anxious  Mood:  anxious and depressed  Thought process:  goal directed  Thought content:    WNL  Sensory/Perceptual disturbances:    WNL  Orientation:  oriented to person, place, time/date, situation, day of week, month of year, year, and stated date of Oct. 3, 2024  Attention:  Fair  Concentration:  Good and Fair  Memory:  WNL  Fund of knowledge:   Good  Insight:    Good  Judgment:   Good  Impulse Control:  Good   Reported Symptoms:  see symptoms above  Risk Assessment: Danger to Self:  No Self-injurious Behavior: No Danger to Others: No Duty to Warn:no Physical Aggression / Violence:No  Access to Firearms a concern: No  Gang Involvement:No  Patient / guardian was educated about steps to take if suicide or homicide risk level increases between visits: denies any SI or HI. While future psychiatric events cannot be accurately predicted, the patient does not currently require acute inpatient psychiatric care and does not currently meet [facility] involuntary commitment criteria.  Substance Abuse History: Current substance abuse: No     Past Psychiatric History:   Previous psychological history is significant for None Outpatient Providers:none History of Psych Hospitalization: No  Psychological Testing:  none    Abuse History: Victim of No.,  n/a    Report needed: No. Victim of  Neglect:No. Perpetrator of  n/a   Witness / Exposure to Domestic Violence: No   Protective Services Involvement: No  Witness to [employer] Violence:  No   Family History:  Family History  Problem Relation Age of Onset   Heart attack Father    Anxiety disorder Father    Vascular Disease Mother    Diabetes Mother    Breast cancer Maternal Grandmother     Living situation: the patient lives with their family (husband); Has 16 yr old son in [place], and 8 yr old daughter in [place], [place] ; Both parents deceased with mom dying last 2023-02-17. as noted above  Sexual Orientation:  Straight  Relationship Status: married 33 years Name of spouse / other:n/a             If a parent, number of children / ages:see above  Support Systems; spouse friends  Financial Stress:  Yes   Income/Employment/Disability: husband currently out of work due to back issues. Patient not working.   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 History of legal issue / charges:  none  Medical History/Surgical History:review and patient confirms. Past Medical History:  Diagnosis Date   Anxiety    Arthritis    Chondromalacia of lateral tibial plateau, right    Depression    Early menopause occurring in patient age younger than 27 years    Family history of adverse reaction to anesthesia    MOTHER-- SEVERE PONV   GAD (generalized anxiety disorder)     History of concussion    x4  per pt from 10/ 2010 to 2011 due to right leg numbness/ weakness post injury, leg would give way fall and hit her head--- per pt residual decreased eye sight , corrected w/ glasses,  and mild short-term memory issues   Insomnia    Low back pain    Numbness of right foot    right ankle/foot  post injury 2010   Pinched vertebral nerve    lumbar region from fall 02-02-2016-- pain down right hip/ goin --  uses heating pad   PONV (postoperative nausea and vomiting)    AND ANXIETY   Poor short term memory    post concussion   Right leg weakness    post injury   Seasonal asthma    Synovitis of right knee    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   left side recently irritated from tooth extraction due to abscess and bone infection (wk of 07-01-2016)   Wears glasses     Past Surgical History:  Procedure Laterality Date   CESAREAN SECTION  1994   EXCISION RIGHT DISTAL THIGH COMMON PERONEAL NERVE SCH[place]OMA NEUROLYSIS  01/17/2010   KNEE ARTHROSCOPY Right 02/2010   KNEE ARTHROSCOPY Right 07/13/2016   Procedure: Right knee arthroscopy with limited synovectomy and chondroplasty medial femeral chondial, lateral tibia plateau ;  Surgeon: Yolonda Kida, MD;  Location: [facility];  Service: Orthopedics;  Laterality: Right;   ORIF RIGHT TIB-FIB FRACTURES  03-01-2009   at [facility]   ORIF RIGHT TIBIA FX (PROVISIONAL) IM NAILING/ REMOVAL DEEP IMPLANTS MEDIAL TIBIA/ TAKEDOWN NONUNION SITE  08/13/2009   TONSILLECTOMY  2010    Medications: Current Outpatient Medications  Medication Sig Dispense Refill   amoxicillin-clavulanate (AUGMENTIN) 875-125 MG tablet 03/09/18 - last dose will taken on 03/10/18  0   gabapentin (NEURONTIN) 400 MG capsule Take 400 mg by mouth 3 (three) times daily.      ibuprofen (ADVIL,MOTRIN) 600 MG tablet TK 1 T PO Q 8 HOURS PRN  0   LORazepam (ATIVAN) 0.5 MG tablet TK 1 T PO BID  1   montelukast  (SINGULAIR) 10 MG tablet Take by mouth.     pantoprazole (PROTONIX) 40 MG tablet Take 1 tablet by mouth daily.     predniSONE (STERAPRED UNI-PAK 21 TAB) 10 MG (21) TBPK tablet 03/09/18 - she is taking her last dose today.  0   traZODone (DESYREL) 100 MG tablet trazodone 100 mg tablet  TK 1/2 TO 1 T PO Q NIGHT     No current facility-administered medications for this visit.    Allergies  Allergen Reactions   Prednisone Other (See Comments) and Itching    Other reaction(s): Insomnia For multiple days.     Hydrocodone Itching   Methocarbamol Other (See Comments)    Per patient causes agitation and shaking     Diagnoses:    ICD-10-CM   1. Generalized anxiety disorder  F41.1      Treatment  goal plan of care: Worked with patient collaboratively on her treatment plan and she is in agreement with it.  Her goals will remain on treatment plan as patient works with strategies in sessions and outside of sessions to meet her goals.  Progress will be assessed each session and documented in the "plan" or "progress" sections of treatment note.  1.Elevate mood and show evidence of usual energy, activities, and socialization level. 2.Express feelings of hurt, disappointment, shame, and anger that are associated with early life experiences. 3.Verbalize hopeful and positive statements regarding the future.  Plan of Care:  This is a first appointment with this patient and today we collaboratively completed her initial evaluation and her initial treatment goal plan.  Philis Doke is a 57 year old, married 33 yrs, and raised 2 children with her husband. Born in [place] and currently living in [place], [place].  Is close to both her son ( 80)  and daughter (4). Both are married and neither have children yet. Both adult kids and family live in [place]. 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  The biggest stressor that led patient to seek help  now is the tragic death of her mother within past yr. (Mistakes made and patient reports several incidents of concern while patient in hospital--not all of that info included in this note but discussed and shared in session.) Added that she was told yrs ago that she had ADHD but not medicated for it. She is alert, motivated, participating very well in initial session today. Worked collaboratively on her initial treatment goal plan and patient is in agreement with it. Patient coming to treatment due to her excessive anxiety and to help her cope more effectively with the tragic death of her mom last year, as patient has continued to struggle emotionally. (Not all details included in this note due to patient privacy needs.) For other information, please see detail in other sections of this initial evaluation found above.   Review of initial treatment goal plan and patient is in agreement.  Next appointment within 2 weeks.   Mathis Fare, LCSW

## 2023-02-18 ENCOUNTER — Ambulatory Visit: Payer: Medicare Other | Admitting: Psychiatry

## 2023-02-18 DIAGNOSIS — F411 Generalized anxiety disorder: Secondary | ICD-10-CM | POA: Diagnosis not present

## 2023-02-18 NOTE — Progress Notes (Signed)
      [facility] Counselor/Therapist Progress Note  Patient ID: Carly Gonzalez, MRN: [id number],    Date: 02/18/2023  Time Spent:  2 weeks  Treatment Type: Individual Therapy  Reported Symptoms: anxiety, depression,unresolved grief due to mom's death, concerns about husband's upcoming serious surgery re: back and heart-related issues   Mental Status Exam:  Appearance:   Casual     Behavior:  Appropriate, Sharing, and Motivated  Motor:  Normal  Speech/Language:   Clear and Coherent  Affect:  Anxious, depressed  Mood:  anxious and depressed  Thought process:  goal directed  Thought content:    WNL  Sensory/Perceptual disturbances:    WNL  Orientation:  oriented to person, place, time/date, situation, day of week, month of year, year, and stated date of Oct. 17, 2024  Attention:  Fair  Concentration:  Fair  Memory:  WNL  Fund of knowledge:   Good  Insight:    Good  Judgment:   Good  Impulse Control:  Good   Risk Assessment: Danger to Self:  No Self-injurious Behavior: No Danger to Others: No Duty to Warn:no Physical Aggression / Violence:No  Access to Firearms a concern: No  Gang Involvement:No   Subjective: Today is patient's first session, following up today on trying to better manage her mom's tragic death. Explains the journey she has been on since mom's death. Very difficult for patient due to all the circumstances involved. (Not all details included in this note.) Had to deal with some unfair situations after mom's death. Holidays hard. Openly sharing her grief, bitterly due to some fallout between others. Working on her unresolved grief and excessive anxiety to help her cope more effectively with the loss of her mom, in order to move forward in a healthier direction.   Interventions: Cognitive Behavioral Therapy and Ego-Supportive  1.  Did not show evidence of usual energy, activities, and socialization level.  Develop the ability to recognize, accept, and cope  with feelings of depression. 2.  Expressed feelings of hurt, disappointment, shame, and anger that are associated with early and later life experiences. 3.  Verbalize hopeful and positive statements regarding the future.  Diagnosis:   ICD-10-CM   1. Generalized anxiety disorder  F41.1      Plan:  This is patient's second appointment here and is showing good motivation and steps to make progress.  She does need to continue working with goal-directed behaviors in order to move and a positive and more hopeful direction.  Goal review and progress/challenges noted with patient.  Next appointment with and 2 weeks.   Mathis Fare, LCSW

## 2023-02-26 ENCOUNTER — Ambulatory Visit: Payer: Medicare Other | Admitting: Psychiatry

## 2023-03-04 ENCOUNTER — Ambulatory Visit: Payer: Medicare Other | Admitting: Psychiatry

## 2023-06-30 ENCOUNTER — Encounter: Payer: Self-pay | Admitting: Gastroenterology

## 2023-08-16 ENCOUNTER — Encounter: Payer: Self-pay | Admitting: Gastroenterology

## 2023-08-16 ENCOUNTER — Ambulatory Visit: Payer: 59 | Admitting: Gastroenterology

## 2023-08-16 VITALS — BP 100/64 | HR 64 | Ht 66.0 in | Wt 175.4 lb

## 2023-08-16 DIAGNOSIS — K219 Gastro-esophageal reflux disease without esophagitis: Secondary | ICD-10-CM | POA: Diagnosis not present

## 2023-08-16 DIAGNOSIS — K5909 Other constipation: Secondary | ICD-10-CM

## 2023-08-16 DIAGNOSIS — R1310 Dysphagia, unspecified: Secondary | ICD-10-CM | POA: Diagnosis not present

## 2023-08-16 DIAGNOSIS — R09A2 Foreign body sensation, throat: Secondary | ICD-10-CM

## 2023-08-16 DIAGNOSIS — Z1211 Encounter for screening for malignant neoplasm of colon: Secondary | ICD-10-CM

## 2023-08-16 DIAGNOSIS — R490 Dysphonia: Secondary | ICD-10-CM | POA: Diagnosis not present

## 2023-08-16 DIAGNOSIS — R1319 Other dysphagia: Secondary | ICD-10-CM

## 2023-08-16 MED ORDER — NA SULFATE-K SULFATE-MG SULF 17.5-3.13-1.6 GM/177ML PO SOLN: 1.0000 | Freq: Once | ORAL | 0 refills | Status: AC

## 2023-08-16 NOTE — Progress Notes (Signed)
 Chief Complaint:screening colonoscopy Primary GI Doctor: Dr. Chales Abrahams  HPI:  Patient is a 58 year old female patient with past medical history of anxiety, GERD, depression, who was referred to me by Hadley Pen, MD on 05/17/23 for a complaint of screening colonoscopy.    Interval History     Patient presents today to discuss EGD/colon, accompanied by husband. Patient has history of GERD and currently taking omeprazole 40 mg twice daily and famotidine 40 mg po daily. She continues with intermittent regurgitation. She also has intermittent esophageal dysphagia with dry foods for the past year. She also has hoarseness with globus sensation. She does have seasonal allergies and takes daily medication. She has been evaluated by ENT for recurrent URI's. She tells me she has had multiple episodes of thrush from steroid therapy. Patient denies nausea, vomiting, or weight loss.    Patient has bowel movement every 7 days which she states is her norm. She denies abdominal pain. She does endorse she told her PCP once about rectal discomfort and told she had external hemorrhoids. She states it went away 2 days later.  Never had EGD or colonoscopy.  Smoker, 3-4 cigarettes per day  Socially drinks.  Patient's family history includes maternal grandmother with breast CA.  Wt Readings from Last 3 Encounters:  08/16/23 175 lb 6 oz (79.5 kg)  03/09/18 165 lb 12 oz (75.2 kg)  07/13/16 159 lb (72.1 kg)    Past Medical History:  Diagnosis Date   Anxiety    Arthritis    Chondromalacia of lateral tibial plateau, right    Depression    Early menopause occurring in patient age younger than 70 years    Family history of adverse reaction to anesthesia    MOTHER-- SEVERE PONV   GAD (generalized anxiety disorder)    History of concussion    x4  per pt from 10/ 2010 to 2011 due to right leg numbness/ weakness post injury, leg would give way fall and hit her head--- per pt residual decreased eye sight ,  corrected w/ glasses,  and mild short-term memory issues   Insomnia    Low back pain    Numbness of right foot    right ankle/foot  post injury 2010   Pinched vertebral nerve    lumbar region from fall 02-02-2016-- pain down right hip/ goin --  uses heating pad   PONV (postoperative nausea and vomiting)    AND ANXIETY   Poor short term memory    post concussion   Right leg weakness    post injury   Seasonal asthma    Synovitis of right knee    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   left side recently irritated from tooth extraction due to abscess and bone infection (wk of 07-01-2016)   Wears glasses     Past Surgical History:  Procedure Laterality Date   CESAREAN SECTION  1994   EXCISION RIGHT DISTAL THIGH COMMON PERONEAL NERVE [place] NEUROLYSIS  01/17/2010   KNEE ARTHROSCOPY Right 02/2010   KNEE ARTHROSCOPY Right 07/13/2016   Procedure: Right knee arthroscopy with limited synovectomy and chondroplasty medial femeral chondial, lateral tibia plateau ;  Surgeon: Yolonda Kida, MD;  Location: [facility];  Service: Orthopedics;  Laterality: Right;   ORIF RIGHT TIB-FIB FRACTURES  03-01-2009   at [facility]   ORIF RIGHT TIBIA FX (PROVISIONAL) IM NAILING/ REMOVAL DEEP IMPLANTS MEDIAL TIBIA/ TAKEDOWN NONUNION SITE  08/13/2009   TONSILLECTOMY  2010    Current Outpatient Medications  Medication Sig Dispense Refill   busPIRone (BUSPAR) 10 MG tablet Take 10 mg by mouth 2 (two) times daily.     diclofenac (VOLTAREN) 75 MG EC tablet Take 75 mg by mouth 2 (two) times daily.     famotidine (PEPCID) 40 MG tablet Take 40 mg by mouth daily.     gabapentin (NEURONTIN) 400 MG capsule Take 400 mg by mouth 3 (three) times daily.      levocetirizine (XYZAL) 5 MG tablet Take 5 mg by mouth daily.     LORazepam (ATIVAN) 0.5 MG tablet TK 1 T PO BID  1   montelukast (SINGULAIR) 10 MG tablet Take by mouth.     omeprazole (PRILOSEC) 40 MG capsule  Take 40 mg by mouth 2 (two) times daily.     pantoprazole (PROTONIX) 40 MG tablet Take 1 tablet by mouth daily.     traZODone (DESYREL) 100 MG tablet trazodone 100 mg tablet  TK 1/2 TO 1 T PO Q NIGHT     No current facility-administered medications for this visit.    Allergies as of 08/16/2023 - Review Complete 08/16/2023  Allergen Reaction Noted   Prednisone Other (See Comments) and Itching 11/07/2020   Hydrocodone Itching 07/07/2016   Methocarbamol Other (See Comments) 11/12/2020   Wound dressing adhesive Hives, Itching, and Swelling 02/03/2016    Family History  Problem Relation Age of Onset   Vascular Disease Mother    Diabetes Mother    Heart attack Father    Anxiety disorder Father    Breast cancer Maternal Grandmother     Review of Systems:    Constitutional: No weight loss, fever, chills, weakness or fatigue HEENT: Eyes: No change in vision               Ears, Nose, Throat:  No change in hearing or congestion Skin: No rash or itching Cardiovascular: No chest pain, chest pressure or palpitations   Respiratory: No SOB or cough Gastrointestinal: See HPI and otherwise negative Genitourinary: No dysuria or change in urinary frequency Neurological: No headache, dizziness or syncope Musculoskeletal: No new muscle or joint pain Hematologic: No bleeding or bruising Psychiatric: No history of depression or anxiety    Physical Exam:  Vital signs: BP 100/64   Pulse 64   Ht 5\' 6"  (1.676 m)   Wt 175 lb 6 oz (79.5 kg)   LMP 07/07/2008   SpO2 96%   BMI 28.31 kg/m   Constitutional:  Pleasant  female appears to be in NAD, Well developed, Well nourished, alert and cooperative Throat: Oral cavity and pharynx without inflammation, swelling or lesion.  Respiratory: Respirations even and unlabored. Lungs clear to auscultation bilaterally.   No wheezes, crackles, or rhonchi.  Cardiovascular: Normal S1, S2. Regular rate and rhythm. No peripheral edema, cyanosis or pallor.   Gastrointestinal:  Soft, nondistended, nontender. No rebound or guarding. Normal bowel sounds. No appreciable masses or hepatomegaly. Rectal:  Not performed.  Msk:  Symmetrical without gross deformities. Without edema, no deformity or joint abnormality.  Neurologic:  Alert and  oriented x4;  grossly normal neurologically.  Skin:   Dry and intact without significant lesions or rashes. Psychiatric: Oriented to person, place and time. Demonstrates good judgement and reason without abnormal affect or behaviors.  RELEVANT LABS AND IMAGING: CBC    Latest Ref Rng & Units 07/13/2016    4:16 PM 07/13/2016   11:12 AM 01/17/2010    8:39 AM  CBC  Hemoglobin 12.0 - 15.0 g/dL 16.1  09.6  04.5      CMP     Latest Ref Rng & Units 08/13/2009    8:34 AM  CMP  Glucose 70 - 99 mg/dL 409   BUN 6 - 23 mg/dL 9   Creatinine 0.4 - 1.2 mg/dL 8.11   Sodium 914 - 782 mEq/L 137   Potassium 3.5 - 5.1 mEq/L 4.3   Chloride 96 - 112 mEq/L 106   CO2 19 - 32 mEq/L 25   Calcium 8.4 - 10.5 mg/dL 9.1      Assessment: Encounter Diagnoses  Name Primary?   Gastroesophageal reflux disease, unspecified whether esophagitis present Yes   Esophageal dysphagia    Hoarseness    Globus sensation    Special screening for malignant neoplasms, colon     58 year old female patient with chronic GERD who presents with dysphagia, hoarseness, regurgitation, and globus sensation despite being on dual therapy. Possible LPR component? Will schedule EGD with dilatation with Dr. Venice Gillis.    She also has chronic constipation and we discussed adding daily fiber supplement with high fiber diet. She can use Miralax as needed.   She has never had colonoscopy, will schedule today with 2 day prep. I will have her do daily Miralax week prior to exam.  Plan: - Recommend High fiber diet -Start Citrucel 1 tsp po daily - Start Miralax po daily 1 week prior to colonoscopy and prn for constipation -Recommend squatty potty - Schedule EGD in [facility]  with Dr. Venice Gillis. The risks and benefits of EGD with possible biopsies and esophageal dilation were discussed with the patient who agrees to proceed.  -Schedule for a colonoscopy with 2 day prep in [facility] with Dr. Venice Gillis. The risks and benefits of colonoscopy with possible polypectomy / biopsies were discussed and the patient agrees to proceed.    Thank you for the courtesy of this consult. Please call me with any questions or concerns.   Lawrie Tunks, FNP-C [facility] 08/16/2023, 10:07 AM  Cc: Melva Stabile, MD

## 2023-08-16 NOTE — Patient Instructions (Addendum)
 Recommend High fiber diet Start Citrucel 1 tsp po daily Start Miralax po daily 1 week prior to colonoscopy and as needed for constipation. The goal is to have three good BM's a week. No straining. Can purchase squatty potty.  We have sent the following medications to your pharmacy for you to pick up at your convenience: SUPREP  You have been scheduled for an endoscopy and colonoscopy. Please follow the written instructions given to you at your visit today.  If you use inhalers (even only as needed), please bring them with you on the day of your procedure.  DO NOT TAKE 7 DAYS PRIOR TO TEST- Trulicity (dulaglutide) Ozempic, Wegovy (semaglutide) Mounjaro (tirzepatide) Bydureon Bcise (exanatide extended release)  DO NOT TAKE 1 DAY PRIOR TO YOUR TEST Rybelsus (semaglutide) Adlyxin (lixisenatide) Victoza (liraglutide) Byetta (exanatide) ___________________________________________________________________________ Due to recent changes in healthcare laws, you may see the results of your imaging and laboratory studies on MyChart before your provider has had a chance to review them.  We understand that in some cases there may be results that are confusing or concerning to you. Not all laboratory results come back in the same time frame and the provider may be waiting for multiple results in order to interpret others.  Please give us  48 hours in order for your provider to thoroughly review all the results before contacting the office for clarification of your results.   _______________________________________________________  If your blood pressure at your visit was 140/90 or greater, please contact your primary care physician to follow up on this.  _______________________________________________________  If you are age 62 or older, your body mass index should be between 23-30. Your Body mass index is 28.31 kg/m. If this is out of the aforementioned range listed, please consider follow up with  your Primary Care Provider.  If you are age 41 or younger, your body mass index should be between 19-25. Your Body mass index is 28.31 kg/m. If this is out of the aformentioned range listed, please consider follow up with your Primary Care Provider.   ________________________________________________________  The [facility] providers would like to encourage you to use MYCHART to communicate with providers for non-urgent requests or questions.  Due to long hold times on the telephone, sending your provider a message by [facility] may be a faster and more efficient way to get a response.  Please allow 48 business hours for a response.  Please remember that this is for non-urgent requests.  _______________________________________________________

## 2023-10-15 ENCOUNTER — Telehealth: Payer: Self-pay | Admitting: Gastroenterology

## 2023-10-15 NOTE — Telephone Encounter (Signed)
 Patient called and stated that she is having a procedure on June the 19 th, but every time she has been put under her side effects from that is being violently sick. Patient stated that she was told that she would get a prescription for Zofran  that she would be able to take before and after procedure. Patient is requesting that we prescribe her that medication. Please advise.

## 2023-10-18 ENCOUNTER — Other Ambulatory Visit: Payer: Self-pay | Admitting: Gastroenterology

## 2023-10-18 DIAGNOSIS — R110 Nausea: Secondary | ICD-10-CM

## 2023-10-18 MED ORDER — ONDANSETRON 4 MG PO TBDP: 4.0000 mg | ORAL_TABLET | Freq: Three times a day (TID) | ORAL | 0 refills | Status: DC | PRN

## 2023-10-18 NOTE — Telephone Encounter (Signed)
 Please f/u with patient to discuss prep.

## 2023-10-18 NOTE — Progress Notes (Signed)
 Zofran  for prior to bowelprep per patient request- rx sent

## 2023-10-21 ENCOUNTER — Encounter: Payer: Self-pay | Admitting: Gastroenterology

## 2023-10-21 ENCOUNTER — Ambulatory Visit: Admitting: Gastroenterology

## 2023-10-21 VITALS — BP 113/65 | HR 65 | Temp 97.5°F | Resp 11 | Ht 66.0 in | Wt 175.0 lb

## 2023-10-21 DIAGNOSIS — K219 Gastro-esophageal reflux disease without esophagitis: Secondary | ICD-10-CM

## 2023-10-21 DIAGNOSIS — K640 First degree hemorrhoids: Secondary | ICD-10-CM

## 2023-10-21 DIAGNOSIS — R1310 Dysphagia, unspecified: Secondary | ICD-10-CM | POA: Diagnosis not present

## 2023-10-21 DIAGNOSIS — Q439 Congenital malformation of intestine, unspecified: Secondary | ICD-10-CM

## 2023-10-21 DIAGNOSIS — K317 Polyp of stomach and duodenum: Secondary | ICD-10-CM

## 2023-10-21 DIAGNOSIS — K635 Polyp of colon: Secondary | ICD-10-CM

## 2023-10-21 DIAGNOSIS — K449 Diaphragmatic hernia without obstruction or gangrene: Secondary | ICD-10-CM | POA: Diagnosis not present

## 2023-10-21 DIAGNOSIS — Z1211 Encounter for screening for malignant neoplasm of colon: Secondary | ICD-10-CM | POA: Diagnosis present

## 2023-10-21 DIAGNOSIS — D125 Benign neoplasm of sigmoid colon: Secondary | ICD-10-CM

## 2023-10-21 DIAGNOSIS — R1319 Other dysphagia: Secondary | ICD-10-CM

## 2023-10-21 MED ORDER — OMEPRAZOLE 40 MG PO CPDR: DELAYED_RELEASE_CAPSULE | ORAL | 3 refills | Status: AC

## 2023-10-21 MED ORDER — FAMOTIDINE 20 MG PO TABS: ORAL_TABLET | ORAL | 3 refills | Status: DC

## 2023-10-21 MED ORDER — SODIUM CHLORIDE 0.9 % IV SOLN: 500.0000 mL | INTRAVENOUS | Status: AC

## 2023-10-21 NOTE — Op Note (Signed)
 [facility] Patient Name: Carly Gonzalez Procedure Date: 10/21/2023 9:19 AM MRN: [id number] Endoscopist: Lajuan Pila , MD, [id number] Age: 58 Referring MD:  Date of Birth: [birth year] Gender: Female Account #: [id number] Procedure:                Upper GI endoscopy Indications:              GERD with dysphagia. Medicines:                Monitored Anesthesia Care Procedure:                Pre-Anesthesia Assessment:                           - Prior to the procedure, a History and Physical                            was performed, and patient medications and                            allergies were reviewed. The patient's tolerance of                            previous anesthesia was also reviewed. The risks                            and benefits of the procedure and the sedation                            options and risks were discussed with the patient.                            All questions were answered, and informed consent                            was obtained. Prior Anticoagulants: The patient has                            taken no anticoagulant or antiplatelet agents. ASA                            Grade Assessment: II - A patient with mild systemic                            disease. After reviewing the risks and benefits,                            the patient was deemed in satisfactory condition to                            undergo the procedure.                           After obtaining informed consent, the endoscope was  passed under direct vision. Throughout the                            procedure, the patient's blood pressure, pulse, and                            oxygen saturations were monitored continuously. The                            Endoscope was introduced through the mouth, and                            advanced to the second part of duodenum. The upper                            GI endoscopy was accomplished  without difficulty.                            The patient tolerated the procedure well. Scope In: Scope Out: Findings:                 No endoscopic abnormality was evident in the                            esophagus to explain the patient's complaint of                            dysphagia. It was decided, however, to proceed with                            dilation of the entire esophagus. The scope was                            withdrawn. Dilation was performed with a Maloney                            dilator with mild resistance at 48 Fr. Biopsies                            were obtained from the proximal and distal                            esophagus with cold forceps for histology of                            suspected eosinophilic esophagitis.                           The Z-line was regular and was found 35 cm from the                            incisors. Examined by NBI.  A small hiatal hernia (< 2 cm) was present.                           The gastroesophageal flap valve was visualized                            endoscopically and classified as Hill Grade III                            (minimal fold, loose to endoscope, hiatal hernia                            likely).                           The entire examined stomach was normal. Biopsies                            were taken with a cold forceps for histology.                           A single 6 mm sessile polyp with no bleeding and no                            stigmata of recent bleeding was found in the                            gastric fundus. The polyp was removed with a cold                            snare. Resection and retrieval were complete.                           The examined duodenum was normal. Biopsies for                            histology were taken with a cold forceps for                            evaluation of celiac disease. Complications:            No immediate  complications. Estimated Blood Loss:     Estimated blood loss: none. Impression:               - Small hiatal hernia.                           - A single gastric polyp. Resected and retrieved.                           - Otherwise normal EGD.                           - S/P empiric esophageal dilatation. Recommendation:           - Patient has  a contact number available for                            emergencies. The signs and symptoms of potential                            delayed complications were discussed with the                            patient. Return to normal activities tomorrow.                            Written discharge instructions were provided to the                            patient.                           - Post dil diet.                           - Continue present medications including omeprazole                            40BID, Pepcid 20 mg p.o. nightly x 4 weeks, then                            can reduce omeprazole to once a day.                           - Await pathology results.                           - Follow an antireflux regimen.                           - Stop all sodas, nonsteroidals                           - I am glad she has quit smoking.                           - If still with problems, further workup and eval                            for TIF                           - The findings and recommendations were discussed                            with the patient's family. Lajuan Pila, MD 10/21/2023 10:04:02 AM This report has been signed electronically.

## 2023-10-21 NOTE — Progress Notes (Signed)
 Vitals-DT  Pt's states no medical or surgical changes since previsit or office visit.  Patient very insistent about where to put IV.  Stated that she didn't want tegaderm After it was placed.  Tegaderm removed.  Patient tolerated the IV insertion poorly.

## 2023-10-21 NOTE — Progress Notes (Signed)
 To pacu, VSS. Report to [email or web address].tb

## 2023-10-21 NOTE — Op Note (Signed)
 [facility] Patient Name: Carly Gonzalez Procedure Date: 10/21/2023 9:08 AM MRN: [id number] Endoscopist: Lajuan Pila , MD, [id number] Age: 58 Referring MD:  Date of Birth: May 19, 1965 Gender: Female Account #: [id number] Procedure:                Colonoscopy Indications:              Screening for colorectal malignant neoplasm Medicines:                Monitored Anesthesia Care Procedure:                Pre-Anesthesia Assessment:                           - Prior to the procedure, a History and Physical                            was performed, and patient medications and                            allergies were reviewed. The patient's tolerance of                            previous anesthesia was also reviewed. The risks                            and benefits of the procedure and the sedation                            options and risks were discussed with the patient.                            All questions were answered, and informed consent                            was obtained. Prior Anticoagulants: The patient has                            taken no anticoagulant or antiplatelet agents. ASA                            Grade Assessment: II - A patient with mild systemic                            disease. After reviewing the risks and benefits,                            the patient was deemed in satisfactory condition to                            undergo the procedure.                           After obtaining informed consent, the colonoscope  was passed under direct vision. Throughout the                            procedure, the patient's blood pressure, pulse, and                            oxygen saturations were monitored continuously. The                            Olympus Scope SN: [id number] was introduced through                            the anus and advanced to the 2 cm into the ileum.                            The  colonoscopy was performed without difficulty.                            The patient tolerated the procedure well. The                            quality of the bowel preparation was good. The                            terminal ileum, ileocecal valve, appendiceal                            orifice, and rectum were photographed. Scope In: 9:41:23 AM Scope Out: 9:56:13 AM Scope Withdrawal Time: 0 hours 11 minutes 11 seconds  Total Procedure Duration: 0 hours 14 minutes 50 seconds  Findings:                 A 4 mm polyp was found in the mid sigmoid colon.                            The polyp was sessile. The polyp was removed with a                            cold biopsy forceps. Resection and retrieval were                            complete.                           Non-bleeding internal hemorrhoids were found during                            retroflexion. The hemorrhoids were small and Grade                            I (internal hemorrhoids that do not prolapse).                           The terminal ileum appeared normal.  Retroflexion in the right colon was performed.                           The exam was otherwise without abnormality on                            direct and retroflexion views. The colon was                            somewhat tortuous. Complications:            No immediate complications. Estimated Blood Loss:     Estimated blood loss: none. Impression:               - One 4 mm polyp in the mid sigmoid colon, removed                            with a cold biopsy forceps. Resected and retrieved.                           - Non-bleeding internal hemorrhoids.                           - The examined portion of the ileum was normal.                           - The examination was otherwise normal on direct                            and retroflexion views. Recommendation:           - Patient has a contact number available for                             emergencies. The signs and symptoms of potential                            delayed complications were discussed with the                            patient. Return to normal activities tomorrow.                            Written discharge instructions were provided to the                            patient.                           - Resume previous diet.                           - Continue present medications. Please take MiraLAX                            17 g p.o. daily.                           -  Await pathology results.                           - Repeat colonoscopy for surveillance based on                            pathology results.                           - The findings and recommendations were discussed                            with the patient's family. Lajuan Pila, MD 10/21/2023 10:07:37 AM This report has been signed electronically.

## 2023-10-21 NOTE — Patient Instructions (Signed)
 YOU HAD AN ENDOSCOPIC PROCEDURE TODAY AT THE [facility]:   Refer to the procedure report that was given to you for any specific questions about what was found during the examination.  If the procedure report does not answer your questions, please call your gastroenterologist to clarify.  If you requested that your care partner not be given the details of your procedure findings, then the procedure report has been included in a sealed envelope for you to review at your convenience later.  YOU SHOULD EXPECT: Some feelings of bloating in the abdomen. Passage of more gas than usual.  Walking can help get rid of the air that was put into your GI tract during the procedure and reduce the bloating. If you had a lower endoscopy (such as a colonoscopy or flexible sigmoidoscopy) you may notice spotting of blood in your stool or on the toilet paper. If you underwent a bowel prep for your procedure, you may not have a normal bowel movement for a few days.  Please Note:  You might notice some irritation and congestion in your nose or some drainage.  This is from the oxygen used during your procedure.  There is no need for concern and it should clear up in a day or so.  SYMPTOMS TO REPORT IMMEDIATELY:  Following lower endoscopy (colonoscopy or flexible sigmoidoscopy):  Excessive amounts of blood in the stool  Significant tenderness or worsening of abdominal pains  Swelling of the abdomen that is new, acute  Fever of 100F or higher  Following upper endoscopy (EGD)  Vomiting of blood or coffee ground material  New chest pain or pain under the shoulder blades  Painful or persistently difficult swallowing  New shortness of breath  Fever of 100F or higher  Black, tarry-looking stools  For urgent or emergent issues, a gastroenterologist can be reached at any hour by calling [phone]. Do not use MyChart messaging for urgent concerns.    DIET:  We do recommend a small meal at first, but  then you may proceed to your regular diet.  Drink plenty of fluids but you should avoid alcoholic beverages for 24 hours.   MEDICATIONS: Continue present medications including Omeprazole 40 mg TWICE daily, forf 4 weeks,  Pepcid 20 mg by mouth once NIGHTLY for 4 weeks, then can reduce Omeprazole to ONCE daily as well as adding Pepcid 20 mg by mouth once NIGHTLY thereafter. Please Use MiraLAX 17 grams by mouth daily. Stop all sodas and Non-Steroidal Anti-Inflammatory Drugs (NSAIDs).  Please see handouts given to you by your recovery nurse: Polyps, Hemorrhoids, Hiatal Hernia and Anti-Reflux precautions.  FOLLOW UP: Await pathology results. If you are still having problems, we will do a further work-up and evaluate for TIF.  Thank you for allowing us  to provide for your healthcare needs today.  ACTIVITY:  You should plan to take it easy for the rest of today and you should NOT DRIVE or use heavy machinery until tomorrow (because of the sedation medicines used during the test).    FOLLOW UP: Our staff will call the number listed on your records the next business day following your procedure.  We will call around 7:15- 8:00 am to check on you and address any questions or concerns that you may have regarding the information given to you following your procedure. If we do not reach you, we will leave a message.     If any biopsies were taken you will be contacted by phone or by letter  within the next 1-3 weeks.  Please call us  at [phone] if you have not heard about the biopsies in 3 weeks.    SIGNATURES/CONFIDENTIALITY: You and/or your care partner have signed paperwork which will be entered into your electronic medical record.  These signatures attest to the fact that that the information above on your After Visit Summary has been reviewed and is understood.  Full responsibility of the confidentiality of this discharge information lies with you and/or your care-partner.

## 2023-10-21 NOTE — Progress Notes (Signed)
 Chief Complaint:screening colonoscopy Primary GI Doctor: Dr. Venice Gillis   HPI:  Patient is a 58 year old female patient with past medical history of anxiety, GERD, depression, who was referred to me by Melva Stabile, MD on 05/17/23 for a complaint of screening colonoscopy.     Interval History     Patient presents today to discuss EGD/colon, accompanied by husband. Patient has history of GERD and currently taking omeprazole 40 mg twice daily and famotidine 40 mg po daily. She continues with intermittent regurgitation. She also has intermittent esophageal dysphagia with dry foods for the past year. She also has hoarseness with globus sensation. She does have seasonal allergies and takes daily medication. She has been evaluated by ENT for recurrent URI's. She tells me she has had multiple episodes of thrush from steroid therapy. Patient denies nausea, vomiting, or weight loss.    Patient has bowel movement every 7 days which she states is her norm. She denies abdominal pain. She does endorse she told her PCP once about rectal discomfort and told she had external hemorrhoids. She states it went away 2 days later.   Never had EGD or colonoscopy.   Smoker, 3-4 cigarettes per day   Socially drinks.   Patient's family history includes maternal grandmother with breast CA.      Wt Readings from Last 3 Encounters:  08/16/23 175 lb 6 oz (79.5 kg)  03/09/18 165 lb 12 oz (75.2 kg)  07/13/16 159 lb (72.1 kg)        Past Medical History:  Diagnosis Date   Anxiety     Arthritis     Chondromalacia of lateral tibial plateau, right     Depression     Early menopause occurring in patient age younger than 51 years     Family history of adverse reaction to anesthesia      MOTHER-- SEVERE PONV   GAD (generalized anxiety disorder)     History of concussion      x4  per pt from 10/ 2010 to 2011 due to right leg numbness/ weakness post injury, leg would give way fall and hit her head--- per pt residual  decreased eye sight , corrected w/ glasses,  and mild short-term memory issues   Insomnia     Low back pain     Numbness of right foot      right ankle/foot  post injury 2010   Pinched vertebral nerve      lumbar region from fall 02-02-2016-- pain down right hip/ goin --  uses heating pad   PONV (postoperative nausea and vomiting)      AND ANXIETY   Poor short term memory      post concussion   Right leg weakness      post injury   Seasonal asthma     Synovitis of right knee     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    left side recently irritated from tooth extraction due to abscess and bone infection (wk of 07-01-2016)   Wears glasses                 Past Surgical History:  Procedure Laterality Date   CESAREAN SECTION   1994   EXCISION RIGHT DISTAL THIGH COMMON PERONEAL NERVE [place] NEUROLYSIS   01/17/2010   KNEE ARTHROSCOPY Right 02/2010   KNEE ARTHROSCOPY Right 07/13/2016    Procedure: Right knee arthroscopy with limited synovectomy and chondroplasty medial femeral chondial, lateral tibia  plateau ;  Surgeon: Janeth Medicus, MD;  Location: [facility];  Service: Orthopedics;  Laterality: Right;   ORIF RIGHT TIB-FIB FRACTURES   03-01-2009   at [facility]   ORIF RIGHT TIBIA FX (PROVISIONAL) IM NAILING/ REMOVAL DEEP IMPLANTS MEDIAL TIBIA/ TAKEDOWN NONUNION SITE   08/13/2009   TONSILLECTOMY   2010                Current Outpatient Medications  Medication Sig Dispense Refill   busPIRone (BUSPAR) 10 MG tablet Take 10 mg by mouth 2 (two) times daily.       diclofenac (VOLTAREN) 75 MG EC tablet Take 75 mg by mouth 2 (two) times daily.       famotidine (PEPCID) 40 MG tablet Take 40 mg by mouth daily.       gabapentin  (NEURONTIN ) 400 MG capsule Take 400 mg by mouth 3 (three) times daily.        levocetirizine (XYZAL) 5 MG tablet Take 5 mg by mouth daily.       LORazepam  (ATIVAN ) 0.5 MG tablet TK 1 T PO BID   1   montelukast  (SINGULAIR) 10 MG tablet Take by mouth.       omeprazole (PRILOSEC) 40 MG capsule Take 40 mg by mouth 2 (two) times daily.       pantoprazole  (PROTONIX ) 40 MG tablet Take 1 tablet by mouth daily.       traZODone  (DESYREL ) 100 MG tablet trazodone  100 mg tablet  TK 1/2 TO 1 T PO Q NIGHT          No current facility-administered medications for this visit.             Allergies as of 08/16/2023 - Review Complete 08/16/2023  Allergen Reaction Noted   Prednisone Other (See Comments) and Itching 11/07/2020   Hydrocodone  Itching 07/07/2016   Methocarbamol Other (See Comments) 11/12/2020   Wound dressing adhesive Hives, Itching, and Swelling 02/03/2016           Family History  Problem Relation Age of Onset   Vascular Disease Mother     Diabetes Mother     Heart attack Father     Anxiety disorder Father     Breast cancer Maternal Grandmother            Review of Systems:    Constitutional: No weight loss, fever, chills, weakness or fatigue HEENT: Eyes: No change in vision               Ears, Nose, Throat:  No change in hearing or congestion Skin: No rash or itching Cardiovascular: No chest pain, chest pressure or palpitations   Respiratory: No SOB or cough Gastrointestinal: See HPI and otherwise negative Genitourinary: No dysuria or change in urinary frequency Neurological: No headache, dizziness or syncope Musculoskeletal: No new muscle or joint pain Hematologic: No bleeding or bruising Psychiatric: No history of depression or anxiety      Physical Exam:  Vital signs: BP 100/64   Pulse 64   Ht 5' 6 (1.676 m)   Wt 175 lb 6 oz (79.5 kg)   LMP 07/07/2008   SpO2 96%   BMI 28.31 kg/m    Constitutional:  Pleasant  female appears to be in NAD, Well developed, Well nourished, alert and cooperative Throat: Oral cavity and pharynx without inflammation, swelling or lesion.  Respiratory: Respirations even and unlabored. Lungs clear to auscultation bilaterally.   No wheezes,  crackles, or rhonchi.  Cardiovascular: Normal  S1, S2. Regular rate and rhythm. No peripheral edema, cyanosis or pallor.  Gastrointestinal:  Soft, nondistended, nontender. No rebound or guarding. Normal bowel sounds. No appreciable masses or hepatomegaly. Rectal:  Not performed.  Msk:  Symmetrical without gross deformities. Without edema, no deformity or joint abnormality.  Neurologic:  Alert and  oriented x4;  grossly normal neurologically.  Skin:   Dry and intact without significant lesions or rashes. Psychiatric: Oriented to person, place and time. Demonstrates good judgement and reason without abnormal affect or behaviors.   RELEVANT LABS AND IMAGING: CBC     Latest Ref Rng & Units 07/13/2016    4:16 PM 07/13/2016   11:12 AM 01/17/2010    8:39 AM  CBC  Hemoglobin 12.0 - 15.0 g/dL 09.6  04.5  40.9       CMP         Latest Ref Rng & Units 08/13/2009    8:34 AM  CMP  Glucose 70 - 99 mg/dL 811   BUN 6 - 23 mg/dL 9   Creatinine 0.4 - 1.2 mg/dL 9.14   Sodium 782 - 956 mEq/L 137   Potassium 3.5 - 5.1 mEq/L 4.3   Chloride 96 - 112 mEq/L 106   CO2 19 - 32 mEq/L 25   Calcium 8.4 - 10.5 mg/dL 9.1       Assessment:     Encounter Diagnoses  Name Primary?   Gastroesophageal reflux disease, unspecified whether esophagitis present Yes   Esophageal dysphagia     Hoarseness     Globus sensation     Special screening for malignant neoplasms, colon      58 year old female patient with chronic GERD who presents with dysphagia, hoarseness, regurgitation, and globus sensation despite being on dual therapy. Possible LPR component? Will schedule EGD with dilatation with Dr. Venice Gillis.    She also has chronic constipation and we discussed adding daily fiber supplement with high fiber diet. She can use Miralax as needed.   She has never had colonoscopy, will schedule today with 2 day prep. I will have her do daily Miralax week prior to exam.   Plan: - Recommend High fiber diet -Start Citrucel 1 tsp  po daily - Start Miralax po daily 1 week prior to colonoscopy and prn for constipation -Recommend squatty potty - Schedule EGD in [facility] with Dr. Venice Gillis. The risks and benefits of EGD with possible biopsies and esophageal dilation were discussed with the patient who agrees to proceed.  -Schedule for a colonoscopy with 2 day prep in [facility] with Dr. Venice Gillis. The risks and benefits of colonoscopy with possible polypectomy / biopsies were discussed and the patient agrees to proceed.    Thank you for the courtesy of this consult. Please call me with any questions or concerns.    Deanna May, FNP-C [facility]    Attending physician's note   I have taken history, reviewed the chart and examined the patient. I performed a substantive portion of this encounter, including complete performance of at least one of the key components, in conjunction with the APP. I agree with the Advanced Practitioner's note, impression and recommendations.   For EGD with possible dil/colon today   Magnus Schuller, MD [facility] [phone]

## 2023-10-22 ENCOUNTER — Telehealth: Payer: Self-pay | Admitting: *Deleted

## 2023-10-22 NOTE — Telephone Encounter (Signed)
  Follow up Call-     10/21/2023    8:29 AM 10/21/2023    8:24 AM  Call back number  Post procedure Call Back phone  # [phone]   Permission to leave phone message  Yes     Patient questions:  Do you have a fever, pain , or abdominal swelling? No. Pain Score  0 *  Have you tolerated food without any problems? Yes.    Have you been able to return to your normal activities? Yes.    Do you have any questions about your discharge instructions: Diet   No. Medications  No. Follow up visit  No.  Do you have questions or concerns about your Care? No.  Actions: * If pain score is 4 or above: No action needed, pain <4.

## 2023-10-26 LAB — SURGICAL PATHOLOGY

## 2023-10-27 ENCOUNTER — Other Ambulatory Visit: Payer: Self-pay

## 2023-10-27 ENCOUNTER — Telehealth: Payer: Self-pay | Admitting: Gastroenterology

## 2023-10-27 DIAGNOSIS — B370 Candidal stomatitis: Secondary | ICD-10-CM

## 2023-10-27 MED ORDER — SUCRALFATE 1 GM/10ML PO SUSP: 1.0000 g | Freq: Four times a day (QID) | ORAL | 0 refills | Status: DC

## 2023-10-27 NOTE — Telephone Encounter (Signed)
Pt made aware of Dr. Lyndel Safe recommendations: Prescription was sent to pharmacy. Pt made aware  Pt verbalized understanding with all questions answered.

## 2023-10-27 NOTE — Telephone Encounter (Signed)
 To some extent it is expected. All biopsies were neg. No thrush-would be visible endoscopically and would show up on biopsies.  Please call in Carafate elixir 1 g p.o. 4 times daily x 7 days If any worsening of symptoms, please let us  know  RG

## 2023-10-27 NOTE — Telephone Encounter (Signed)
 Pt stated that she had her procedure on 10/21/2023 ( EGD and Colonoscopy) Pt stated that starting on Saturday 10/23/2023 that she started having increase in heartburn and   burning and in her throat and epigastric area. Pt stated that she can feel the food in her epigastric area and feel the acid come back up; Can't lie down flat, Taking Omeprazole  twice a day and last night to Pepcid  40 mg before bed. Pt stated that she thinks that she has thrush in her mouth  as her tongue and throat are white. Pt stated that right right under her bra in her mid chest( xiphoid process location confirmed with pt) that it is tender and sore to the touch.  Please review and advise

## 2023-10-27 NOTE — Telephone Encounter (Signed)
 Left message for pt to call back

## 2023-10-27 NOTE — Telephone Encounter (Signed)
 Inbound call from patient stated she was having difficulty swallowing and gerd issues would like a call back from nurse to discuss issue   Please advise  Thank you

## 2023-10-31 ENCOUNTER — Other Ambulatory Visit: Payer: Self-pay | Admitting: Gastroenterology

## 2023-10-31 DIAGNOSIS — B370 Candidal stomatitis: Secondary | ICD-10-CM

## 2023-11-04 ENCOUNTER — Ambulatory Visit: Payer: Self-pay | Admitting: Gastroenterology

## 2023-12-31 ENCOUNTER — Other Ambulatory Visit (HOSPITAL_BASED_OUTPATIENT_CLINIC_OR_DEPARTMENT_OTHER): Payer: Self-pay

## 2023-12-31 ENCOUNTER — Encounter (HOSPITAL_BASED_OUTPATIENT_CLINIC_OR_DEPARTMENT_OTHER): Payer: Self-pay

## 2023-12-31 ENCOUNTER — Ambulatory Visit (HOSPITAL_BASED_OUTPATIENT_CLINIC_OR_DEPARTMENT_OTHER)
Admission: RE | Admit: 2023-12-31 | Discharge: 2023-12-31 | Disposition: A | Discharge status: Home / Self Care | Source: Ambulatory Visit | Attending: Physician Assistant | Admitting: Physician Assistant

## 2023-12-31 VITALS — BP 113/76 | HR 65 | Temp 97.9°F | Resp 20

## 2023-12-31 DIAGNOSIS — M5442 Lumbago with sciatica, left side: Secondary | ICD-10-CM

## 2023-12-31 DIAGNOSIS — J0141 Acute recurrent pansinusitis: Secondary | ICD-10-CM | POA: Diagnosis not present

## 2023-12-31 DIAGNOSIS — B379 Candidiasis, unspecified: Secondary | ICD-10-CM

## 2023-12-31 MED ORDER — TIZANIDINE HCL 4 MG PO TABS
4.0000 mg | ORAL_TABLET | Freq: Three times a day (TID) | ORAL | 0 refills | Status: AC | PRN
  Filled 2023-12-31: qty 21, 7d supply, fill #0

## 2023-12-31 MED ORDER — AMOXICILLIN-POT CLAVULANATE 875-125 MG PO TABS
1.0000 | ORAL_TABLET | Freq: Two times a day (BID) | ORAL | 0 refills | Status: AC
  Filled 2023-12-31: qty 14, 7d supply, fill #0

## 2023-12-31 MED ORDER — KETOROLAC TROMETHAMINE 30 MG/ML IJ SOLN
30.0000 mg | Freq: Once | INTRAMUSCULAR | Status: AC
  Administered 2023-12-31: 30 mg via INTRAMUSCULAR

## 2023-12-31 MED ORDER — FLUCONAZOLE 150 MG PO TABS
150.0000 mg | ORAL_TABLET | Freq: Once | ORAL | 0 refills | Status: AC
  Filled 2023-12-31: qty 1, 1d supply, fill #0
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
## 2023-12-31 NOTE — Discharge Instructions (Signed)
 We are starting Augmentin  to cover for sinus infection.  Please take this twice a day.  I also recommend you continue over-the-counter medications including Mucinex, Flonase, nasal saline/sinus rinses.  I have called in 1 dose of Diflucan  in case you develop yeast infection; take this at the first sign of yeast infection symptoms.  If your symptoms or not improving or if anything worsens please return for reevaluation.  We gave an injection of Toradol  today.  Please do not take additional NSAIDs for the next 24 hours.  This includes aspirin , ibuprofen/Advil, naproxen/Aleve.  You can use Tylenol /acetaminophen  as needed.  Take tizanidine  up to 3 times a day.  This will make you sleepy so do not drive or drink alcohol while taking it.  This will replace your previously prescribed cyclobenzaprine .  I do recommend that you follow-up with orthopedics; call to schedule appointment with [facility] since you are already established with them.  I also recommend establishing with physical therapy.  If anything worsens and you have severe pain, inability to walk, going to the bathroom yourself without noticing it, weakness in your legs, numbness in the inside of your legs you need to go to the emergency room immediately.

## 2023-12-31 NOTE — ED Triage Notes (Addendum)
 Pt c/o bilateral lower back pain that radiates down her legs for around 1 month. She was seen by her pcp and was told to stretch it out and it would get better. She has continued to go to the gym and do stretches but the pain has gotten worst and now it hurts to sit and walk. Pain is described as burning. She was prescribed Voltaren and completed the rx but has not had any relief.   Pt is also having nasal congestion, cough-productive, facial pain, HA, sore throat, and sweating. Pt denies fever, body aches, and chills. She has test neg for covid. Pt has taken musinex with no relief.
